# Patient Record
Sex: Female | Born: 1945
Health system: Southern US, Community
[De-identification: ages and names within clinical notes are randomized; demographics above are authoritative.]

## PROBLEM LIST (undated history)

## (undated) DIAGNOSIS — Z974 Presence of external hearing-aid: Secondary | ICD-10-CM

## (undated) DIAGNOSIS — R51 Headache: Secondary | ICD-10-CM

## (undated) DIAGNOSIS — F329 Major depressive disorder, single episode, unspecified: Secondary | ICD-10-CM

## (undated) DIAGNOSIS — G243 Spasmodic torticollis: Secondary | ICD-10-CM

## (undated) DIAGNOSIS — K219 Gastro-esophageal reflux disease without esophagitis: Secondary | ICD-10-CM

## (undated) DIAGNOSIS — M1990 Unspecified osteoarthritis, unspecified site: Secondary | ICD-10-CM

## (undated) DIAGNOSIS — K5900 Constipation, unspecified: Secondary | ICD-10-CM

## (undated) DIAGNOSIS — Z8489 Family history of other specified conditions: Secondary | ICD-10-CM

## (undated) DIAGNOSIS — J189 Pneumonia, unspecified organism: Secondary | ICD-10-CM

## (undated) DIAGNOSIS — I10 Essential (primary) hypertension: Secondary | ICD-10-CM

## (undated) DIAGNOSIS — F32A Depression, unspecified: Secondary | ICD-10-CM

## (undated) DIAGNOSIS — R0602 Shortness of breath: Secondary | ICD-10-CM

## (undated) HISTORY — PX: CERVICAL FUSION: SHX112

## (undated) HISTORY — PX: ABDOMINAL HYSTERECTOMY: SHX81

---

## 2014-06-13 ENCOUNTER — Other Ambulatory Visit: Payer: Self-pay | Admitting: Otolaryngology

## 2014-06-13 DIAGNOSIS — K148 Other diseases of tongue: Secondary | ICD-10-CM

## 2014-06-13 DIAGNOSIS — R220 Localized swelling, mass and lump, head: Principal | ICD-10-CM

## 2014-06-16 ENCOUNTER — Ambulatory Visit
Admission: RE | Admit: 2014-06-16 | Discharge: 2014-06-16 | Disposition: A | Discharge status: Home / Self Care | Payer: Medicare Other | Source: Ambulatory Visit | Attending: Otolaryngology | Admitting: Otolaryngology

## 2014-06-16 DIAGNOSIS — K148 Other diseases of tongue: Secondary | ICD-10-CM

## 2014-06-16 DIAGNOSIS — R220 Localized swelling, mass and lump, head: Principal | ICD-10-CM

## 2014-06-16 MED ORDER — IOHEXOL 300 MG/ML  SOLN
75.0000 mL | Freq: Once | INTRAMUSCULAR | Status: AC | PRN
  Administered 2014-06-16: 75 mL via INTRAVENOUS

## 2014-06-26 ENCOUNTER — Encounter (HOSPITAL_COMMUNITY): Payer: Self-pay | Admitting: Pharmacy Technician

## 2014-06-26 ENCOUNTER — Other Ambulatory Visit: Payer: Self-pay | Admitting: Otolaryngology

## 2014-06-27 ENCOUNTER — Encounter (HOSPITAL_COMMUNITY)
Admission: RE | Admit: 2014-06-27 | Discharge: 2014-06-27 | Disposition: A | Discharge status: Home / Self Care | Payer: Medicare Other | Source: Ambulatory Visit | Attending: Anesthesiology | Admitting: Anesthesiology

## 2014-06-27 ENCOUNTER — Encounter (HOSPITAL_COMMUNITY): Payer: Self-pay

## 2014-06-27 ENCOUNTER — Encounter (HOSPITAL_COMMUNITY)
Admission: RE | Admit: 2014-06-27 | Discharge: 2014-06-27 | Disposition: A | Discharge status: Home / Self Care | Payer: Medicare Other | Source: Ambulatory Visit | Attending: Otolaryngology | Admitting: Otolaryngology

## 2014-06-27 ENCOUNTER — Encounter (HOSPITAL_COMMUNITY): Payer: Self-pay | Admitting: *Deleted

## 2014-06-27 DIAGNOSIS — R220 Localized swelling, mass and lump, head: Secondary | ICD-10-CM | POA: Diagnosis present

## 2014-06-27 DIAGNOSIS — K5900 Constipation, unspecified: Secondary | ICD-10-CM | POA: Diagnosis not present

## 2014-06-27 DIAGNOSIS — Z885 Allergy status to narcotic agent status: Secondary | ICD-10-CM | POA: Diagnosis not present

## 2014-06-27 DIAGNOSIS — I10 Essential (primary) hypertension: Secondary | ICD-10-CM | POA: Diagnosis not present

## 2014-06-27 DIAGNOSIS — F3289 Other specified depressive episodes: Secondary | ICD-10-CM | POA: Diagnosis not present

## 2014-06-27 DIAGNOSIS — K219 Gastro-esophageal reflux disease without esophagitis: Secondary | ICD-10-CM | POA: Diagnosis not present

## 2014-06-27 DIAGNOSIS — Z882 Allergy status to sulfonamides status: Secondary | ICD-10-CM | POA: Diagnosis not present

## 2014-06-27 DIAGNOSIS — R221 Localized swelling, mass and lump, neck: Secondary | ICD-10-CM | POA: Diagnosis present

## 2014-06-27 DIAGNOSIS — Z87891 Personal history of nicotine dependence: Secondary | ICD-10-CM | POA: Diagnosis not present

## 2014-06-27 DIAGNOSIS — F329 Major depressive disorder, single episode, unspecified: Secondary | ICD-10-CM | POA: Diagnosis not present

## 2014-06-27 DIAGNOSIS — K140 Glossitis: Secondary | ICD-10-CM | POA: Diagnosis not present

## 2014-06-27 DIAGNOSIS — Z79899 Other long term (current) drug therapy: Secondary | ICD-10-CM | POA: Diagnosis not present

## 2014-06-27 HISTORY — DX: Spasmodic torticollis: G24.3

## 2014-06-27 LAB — CBC
HCT: 40.9 % (ref 36.0–46.0)
HEMOGLOBIN: 13.9 g/dL (ref 12.0–15.0)
MCH: 30.4 pg (ref 26.0–34.0)
MCHC: 34 g/dL (ref 30.0–36.0)
MCV: 89.5 fL (ref 78.0–100.0)
Platelets: 138 10*3/uL — ABNORMAL LOW (ref 150–400)
RBC: 4.57 MIL/uL (ref 3.87–5.11)
RDW: 12.9 % (ref 11.5–15.5)
WBC: 6.3 10*3/uL (ref 4.0–10.5)

## 2014-06-27 LAB — BASIC METABOLIC PANEL
ANION GAP: 11 (ref 5–15)
BUN: 11 mg/dL (ref 6–23)
CHLORIDE: 103 meq/L (ref 96–112)
CO2: 29 mEq/L (ref 19–32)
Calcium: 9.8 mg/dL (ref 8.4–10.5)
Creatinine, Ser: 0.65 mg/dL (ref 0.50–1.10)
GFR calc non Af Amer: >90 mL/min (ref >90)
Glucose, Bld: 92 mg/dL (ref 70–99)
POTASSIUM: 4.2 meq/L (ref 3.7–5.3)
SODIUM: 143 meq/L (ref 137–147)

## 2014-06-27 NOTE — Pre-Procedure Instructions (Addendum)
Tillie RungBarbara Harriet Huckins  06/27/2014   Your procedure is scheduled on:  Wednesday, September 30.  [facility]  9:00 AM.  Call this number if you have problems the morning of surgery: [phone]   Remember:   Do not eat food or drink liquids after midnight.   Take these medicines the morning of surgery with A SIP OF WATER: felodipine (PLENDIL), clonazePAM (KLONOPIN), meclizine (ANTIVERT), omeprazole (PRILOSEC), sertraline (ZOLOFT), baclofen (LIORESAL).                Stop taking all vitamins, Herbal medications and NSAIDS (meloxicam (MOBIC).       Do not wear jewelry, make-up or nail polish.  Do not wear lotions, powders, or perfumes.   Do not shave 48 hours prior to surgery.   Do not bring valuables to the hospital.             [facility] is not responsible  for any belongings or valuables.               Contacts, dentures or bridgework may not be worn into surgery.  Leave suitcase in the car. After surgery it may be brought to your room.  For patients admitted to the hospital, discharge time is determined by your treatment team.               Patients discharged the day of surgery will not be allowed to drive home.  Name and phone number of your driver: -   Special Instructions: Review  [place] - Preparing For Surgery.   Please read over the following fact sheets that you were given: Pain Booklet, Coughing and Deep Breathing and Surgical Site Infection Prevention

## 2014-06-27 NOTE — Progress Notes (Signed)
06/27/14 1508  OBSTRUCTIVE SLEEP APNEA  Have you ever been diagnosed with sleep apnea through a sleep study? No  Do you snore loudly (loud enough to be heard through closed doors)?  0  Do you often feel tired, fatigued, or sleepy during the daytime? 1  Has anyone observed you stop breathing during your sleep? 0  Do you have, or are you being treated for high blood pressure? 1  BMI more than 35 kg/m2? 1  Age over 66 years old? 1  Neck circumference greater than 40 cm/16 inches? 1 (16.5)  Gender: 0  Obstructive Sleep Apnea Score 5  Score 4 or greater  Results sent to PCP

## 2014-06-28 ENCOUNTER — Encounter (HOSPITAL_COMMUNITY): Payer: Self-pay | Admitting: Otolaryngology

## 2014-06-28 ENCOUNTER — Ambulatory Visit (HOSPITAL_COMMUNITY): Payer: Medicare Other | Admitting: Certified Registered Nurse Anesthetist

## 2014-06-28 ENCOUNTER — Encounter (HOSPITAL_COMMUNITY)
Admission: RE | Disposition: A | Discharge status: Home / Self Care | Payer: Self-pay | Source: Ambulatory Visit | Attending: Otolaryngology

## 2014-06-28 ENCOUNTER — Ambulatory Visit (HOSPITAL_COMMUNITY)
Admission: RE | Admit: 2014-06-28 | Discharge: 2014-06-28 | Disposition: A | Discharge status: Home / Self Care | Payer: Medicare Other | Source: Ambulatory Visit | Attending: Otolaryngology | Admitting: Otolaryngology

## 2014-06-28 ENCOUNTER — Encounter (HOSPITAL_COMMUNITY): Payer: Medicare Other | Admitting: Certified Registered Nurse Anesthetist

## 2014-06-28 DIAGNOSIS — Z882 Allergy status to sulfonamides status: Secondary | ICD-10-CM | POA: Insufficient documentation

## 2014-06-28 DIAGNOSIS — I10 Essential (primary) hypertension: Secondary | ICD-10-CM | POA: Diagnosis not present

## 2014-06-28 DIAGNOSIS — F329 Major depressive disorder, single episode, unspecified: Secondary | ICD-10-CM | POA: Insufficient documentation

## 2014-06-28 DIAGNOSIS — Z79899 Other long term (current) drug therapy: Secondary | ICD-10-CM | POA: Insufficient documentation

## 2014-06-28 DIAGNOSIS — K140 Glossitis: Secondary | ICD-10-CM | POA: Insufficient documentation

## 2014-06-28 DIAGNOSIS — K5900 Constipation, unspecified: Secondary | ICD-10-CM | POA: Insufficient documentation

## 2014-06-28 DIAGNOSIS — F3289 Other specified depressive episodes: Secondary | ICD-10-CM | POA: Insufficient documentation

## 2014-06-28 DIAGNOSIS — K219 Gastro-esophageal reflux disease without esophagitis: Secondary | ICD-10-CM | POA: Insufficient documentation

## 2014-06-28 DIAGNOSIS — R220 Localized swelling, mass and lump, head: Secondary | ICD-10-CM

## 2014-06-28 DIAGNOSIS — Z87891 Personal history of nicotine dependence: Secondary | ICD-10-CM | POA: Insufficient documentation

## 2014-06-28 DIAGNOSIS — K148 Other diseases of tongue: Secondary | ICD-10-CM | POA: Diagnosis present

## 2014-06-28 DIAGNOSIS — Z885 Allergy status to narcotic agent status: Secondary | ICD-10-CM | POA: Insufficient documentation

## 2014-06-28 HISTORY — DX: Gastro-esophageal reflux disease without esophagitis: K21.9

## 2014-06-28 HISTORY — DX: Presence of external hearing-aid: Z97.4

## 2014-06-28 HISTORY — DX: Family history of other specified conditions: Z84.89

## 2014-06-28 HISTORY — DX: Constipation, unspecified: K59.00

## 2014-06-28 HISTORY — DX: Unspecified osteoarthritis, unspecified site: M19.90

## 2014-06-28 HISTORY — DX: Major depressive disorder, single episode, unspecified: F32.9

## 2014-06-28 HISTORY — DX: Pneumonia, unspecified organism: J18.9

## 2014-06-28 HISTORY — DX: Essential (primary) hypertension: I10

## 2014-06-28 HISTORY — DX: Shortness of breath: R06.02

## 2014-06-28 HISTORY — PX: EXCISION OF TONGUE LESION: SHX6434

## 2014-06-28 HISTORY — DX: Headache: R51

## 2014-06-28 HISTORY — DX: Depression, unspecified: F32.A

## 2014-06-28 SURGERY — EXCISION, LESION, TONGUE
Anesthesia: General | Laterality: Right

## 2014-06-28 MED ORDER — AMOXICILLIN-POT CLAVULANATE 250-62.5 MG/5ML PO SUSR: 10.0000 mL | Freq: Two times a day (BID) | ORAL | Status: DC

## 2014-06-28 MED ORDER — SCOPOLAMINE 1 MG/3DAYS TD PT72
MEDICATED_PATCH | TRANSDERMAL | Status: AC
  Filled 2014-06-28: qty 1

## 2014-06-28 MED ORDER — PROPOFOL 10 MG/ML IV BOLUS
INTRAVENOUS | Status: AC
  Filled 2014-06-28: qty 20

## 2014-06-28 MED ORDER — LIDOCAINE-EPINEPHRINE 1 %-1:100000 IJ SOLN
INTRAMUSCULAR | Status: DC | PRN
  Administered 2014-06-28: 20 mL

## 2014-06-28 MED ORDER — 0.9 % SODIUM CHLORIDE (POUR BTL) OPTIME
TOPICAL | Status: DC | PRN
  Administered 2014-06-28: 1000 mL

## 2014-06-28 MED ORDER — FENTANYL CITRATE 0.05 MG/ML IJ SOLN
INTRAMUSCULAR | Status: DC | PRN
  Administered 2014-06-28: 50 ug via INTRAVENOUS
  Administered 2014-06-28: 100 ug via INTRAVENOUS

## 2014-06-28 MED ORDER — CEFAZOLIN SODIUM-DEXTROSE 2-3 GM-% IV SOLR
INTRAVENOUS | Status: AC
  Administered 2014-06-28: 2 g via INTRAVENOUS
  Filled 2014-06-28: qty 50

## 2014-06-28 MED ORDER — LIDOCAINE HCL (CARDIAC) 20 MG/ML IV SOLN
INTRAVENOUS | Status: AC
  Filled 2014-06-28: qty 5

## 2014-06-28 MED ORDER — HYDROCODONE-ACETAMINOPHEN 7.5-325 MG/15ML PO SOLN: 10.0000 mL | ORAL | Status: DC | PRN

## 2014-06-28 MED ORDER — FENTANYL CITRATE 0.05 MG/ML IJ SOLN
INTRAMUSCULAR | Status: AC
  Filled 2014-06-28: qty 5

## 2014-06-28 MED ORDER — SCOPOLAMINE 1 MG/3DAYS TD PT72
1.0000 | MEDICATED_PATCH | TRANSDERMAL | Status: DC
  Administered 2014-06-28: 1.5 mg via TRANSDERMAL

## 2014-06-28 MED ORDER — PROPOFOL 10 MG/ML IV BOLUS
INTRAVENOUS | Status: DC | PRN
  Administered 2014-06-28: 150 mg via INTRAVENOUS

## 2014-06-28 MED ORDER — ONDANSETRON HCL 4 MG/2ML IJ SOLN
INTRAMUSCULAR | Status: DC | PRN
  Administered 2014-06-28: 4 mg via INTRAVENOUS

## 2014-06-28 MED ORDER — HYDROMORPHONE HCL 1 MG/ML IJ SOLN
0.2500 mg | INTRAMUSCULAR | Status: DC | PRN
  Administered 2014-06-28: 0.5 mg via INTRAVENOUS

## 2014-06-28 MED ORDER — MIDAZOLAM HCL 5 MG/5ML IJ SOLN
INTRAMUSCULAR | Status: DC | PRN
  Administered 2014-06-28: 2 mg via INTRAVENOUS

## 2014-06-28 MED ORDER — DEXAMETHASONE SODIUM PHOSPHATE 4 MG/ML IJ SOLN
INTRAMUSCULAR | Status: DC | PRN
  Administered 2014-06-28: 10 mg via INTRAVENOUS

## 2014-06-28 MED ORDER — LACTATED RINGERS IV SOLN
INTRAVENOUS | Status: DC | PRN
  Administered 2014-06-28: 11:00:00 via INTRAVENOUS

## 2014-06-28 MED ORDER — DEXAMETHASONE SODIUM PHOSPHATE 4 MG/ML IJ SOLN
INTRAMUSCULAR | Status: AC
  Filled 2014-06-28: qty 3

## 2014-06-28 MED ORDER — SUCCINYLCHOLINE CHLORIDE 20 MG/ML IJ SOLN
INTRAMUSCULAR | Status: DC | PRN
  Administered 2014-06-28: 100 mg via INTRAVENOUS

## 2014-06-28 MED ORDER — LIDOCAINE HCL (CARDIAC) 20 MG/ML IV SOLN
INTRAVENOUS | Status: DC | PRN
  Administered 2014-06-28: 80 mg via INTRAVENOUS

## 2014-06-28 MED ORDER — HYDROMORPHONE HCL 1 MG/ML IJ SOLN
INTRAMUSCULAR | Status: AC
  Filled 2014-06-28: qty 1

## 2014-06-28 MED ORDER — MIDAZOLAM HCL 2 MG/2ML IJ SOLN
INTRAMUSCULAR | Status: AC
  Filled 2014-06-28: qty 2

## 2014-06-28 MED ORDER — PROMETHAZINE HCL 25 MG/ML IJ SOLN: 6.2500 mg | INTRAMUSCULAR | Status: DC | PRN

## 2014-06-28 MED ORDER — LACTATED RINGERS IV SOLN
INTRAVENOUS | Status: DC
  Administered 2014-06-28: 10:00:00 via INTRAVENOUS

## 2014-06-28 MED ORDER — BACITRACIN ZINC 500 UNIT/GM EX OINT
TOPICAL_OINTMENT | CUTANEOUS | Status: AC
Start: 2014-06-28 — End: 2014-06-28
  Filled 2014-06-28: qty 15

## 2014-06-28 MED ORDER — LIDOCAINE-EPINEPHRINE 1 %-1:100000 IJ SOLN
INTRAMUSCULAR | Status: AC
  Filled 2014-06-28: qty 1

## 2014-06-28 SURGICAL SUPPLY — 32 items
CANISTER SUCTION 2500CC (MISCELLANEOUS) ×2 IMPLANT
CLEANER TIP ELECTROSURG 2X2 (MISCELLANEOUS) ×2 IMPLANT
COVER SURGICAL LIGHT HANDLE (MISCELLANEOUS) ×2 IMPLANT
ELECT COATED BLADE 2.86 ST (ELECTRODE) ×2 IMPLANT
ELECT REM PT RETURN 9FT ADLT (ELECTROSURGICAL) ×2
ELECTRODE REM PT RTRN 9FT ADLT (ELECTROSURGICAL) ×1 IMPLANT
GAUZE SPONGE 4X4 16PLY XRAY LF (GAUZE/BANDAGES/DRESSINGS) ×2 IMPLANT
GLOVE BIOGEL M 7.0 STRL (GLOVE) ×2 IMPLANT
GLOVE BIOGEL PI IND STRL 7.0 (GLOVE) ×2 IMPLANT
GLOVE BIOGEL PI INDICATOR 7.0 (GLOVE) ×2
GLOVE SS N UNI LF 7.0 STRL (GLOVE) ×2 IMPLANT
GLOVE SURG SS PI 7.0 STRL IVOR (GLOVE) ×2 IMPLANT
GOWN STRL REUS W/ TWL LRG LVL3 (GOWN DISPOSABLE) ×2 IMPLANT
GOWN STRL REUS W/TWL LRG LVL3 (GOWN DISPOSABLE) ×2
KIT BASIN OR (CUSTOM PROCEDURE TRAY) ×2 IMPLANT
KIT ROOM TURNOVER OR (KITS) ×2 IMPLANT
NS IRRIG 1000ML POUR BTL (IV SOLUTION) ×2 IMPLANT
PAD ARMBOARD 7.5X6 YLW CONV (MISCELLANEOUS) ×4 IMPLANT
PENCIL BUTTON HOLSTER BLD 10FT (ELECTRODE) ×2 IMPLANT
SPONGE INTESTINAL PEANUT (DISPOSABLE) ×2 IMPLANT
SPONGE LAP 18X18 X RAY DECT (DISPOSABLE) ×2 IMPLANT
STAPLER VISISTAT 35W (STAPLE) ×2 IMPLANT
SUT ETHILON 2 0 FS 18 (SUTURE) ×2 IMPLANT
SUT SILK 2 0 FS (SUTURE) ×2 IMPLANT
SUT SILK 3 0 REEL (SUTURE) ×2 IMPLANT
SUT VIC AB 3-0 SH 27 (SUTURE) ×1
SUT VIC AB 3-0 SH 27XBRD (SUTURE) ×1 IMPLANT
SYR BULB 3OZ (MISCELLANEOUS) ×2 IMPLANT
TOWEL OR 17X24 6PK STRL BLUE (TOWEL DISPOSABLE) ×2 IMPLANT
TOWEL OR 17X26 10 PK STRL BLUE (TOWEL DISPOSABLE) ×2 IMPLANT
TRAY ENT MC OR (CUSTOM PROCEDURE TRAY) ×2 IMPLANT
TUBE CONNECTING 12X1/4 (SUCTIONS) ×2 IMPLANT

## 2014-06-28 NOTE — H&P (Signed)
Bethany Fuller is an 68 y.o. female.   Chief Complaint: Right tongue mass HPI: Rt lateral tongue mass, prev bx benign. Failure to resolve with med tx.  Past Medical History  Diagnosis Date  . Family history of anesthesia complication     Mother- nausea  . Hypertension   . Headache(784.0)     Miagrains- now only once a year. Has light migranies from Dystonia  . Shortness of breath     with extertion   . Pneumonia     2011  . Depression   . GERD (gastroesophageal reflux disease)   . Constipation   . Arthritis   . Wears hearing aid     right  . Cervical dystonia     Past Surgical History  Procedure Laterality Date  . Abdominal hysterectomy    . Cervical fusion      4, 5, 6    History reviewed. No pertinent family history. Social History:  reports that she has quit smoking. She does not have any smokeless tobacco history on file. She reports that she does not drink alcohol or use illicit drugs.  Allergies:  Allergies  Allergen Reactions  . Morphine And Related Itching and Rash  . Sulfa Antibiotics Itching and Rash    Medications Prior to Admission  Medication Sig Dispense Refill  . baclofen (LIORESAL) 10 MG tablet Take 20 mg by mouth 3 (three) times daily.      . calcium carbonate (OS-CAL) 600 MG TABS tablet Take 1,200 mg by mouth daily.      . Cholecalciferol (VITAMIN D-3) 5000 UNITS TABS Take 5,000 Units by mouth daily.      . clonazePAM (KLONOPIN) 0.5 MG tablet Take 1.5 mg by mouth 3 (three) times daily.      . felodipine (PLENDIL) 2.5 MG 24 hr tablet Take 2.5 mg by mouth daily.      Marland Kitchen gabapentin (NEURONTIN) 300 MG capsule Take 300-900 mg by mouth 3 (three) times daily. 300 mg every morning and at lunch, then take 900 mg at bedtime      . meclizine (ANTIVERT) 25 MG tablet Take 25 mg by mouth daily. Scheduled 1 time a day.      . meclizine (ANTIVERT) 25 MG tablet Take 25 mg by mouth 3 (three) times daily as needed for dizziness.      . meloxicam (MOBIC) 15 MG  tablet Take 15 mg by mouth daily.      . Multiple Vitamin (MULTIVITAMIN WITH MINERALS) TABS tablet Take 1 tablet by mouth daily.      . Multiple Vitamins-Minerals (HAIR/SKIN/NAILS PO) Take 3 tablets by mouth daily.      . Omega-3 Fatty Acids (FISH OIL) 1200 MG CAPS Take 1,200 mg by mouth daily.      Marland Kitchen omeprazole (PRILOSEC) 20 MG capsule Take 20 mg by mouth daily.      . OnabotulinumtoxinA (BOTOX IJ) Inject as directed every 3 (three) months.      Marland Kitchen oxybutynin (DITROPAN-XL) 10 MG 24 hr tablet Take 10 mg by mouth at bedtime.      . polyethylene glycol (MIRALAX / GLYCOLAX) packet Take 17 g by mouth as needed.      . Pyridoxine HCl (VITAMIN B-6 PO) Take 1 tablet by mouth daily.      Marland Kitchen rOPINIRole (REQUIP) 0.25 MG tablet Take 1 mg by mouth at bedtime.      . sertraline (ZOLOFT) 100 MG tablet Take 150 mg by mouth daily.      Marland Kitchen  simvastatin (ZOCOR) 80 MG tablet Take 80 mg by mouth at bedtime.      . vitamin E 1000 UNIT capsule Take 1,000 Units by mouth daily.      . Calcium-Magnesium-Vitamin D (CALCIUM 1200+D3 PO) Take 1 tablet by mouth daily.        Results for orders placed during the hospital encounter of 06/28/14 (from the past 48 hour(s))  BASIC METABOLIC PANEL     Status: None   Collection Time    06/27/14  3:38 PM      Result Value Ref Range   Sodium 143  137 - 147 mEq/L   Potassium 4.2  3.7 - 5.3 mEq/L   Chloride 103  96 - 112 mEq/L   CO2 29  19 - 32 mEq/L   Glucose, Bld 92  70 - 99 mg/dL   BUN 11  6 - 23 mg/dL   Creatinine, Ser 0.65  0.50 - 1.10 mg/dL   Calcium 9.8  8.4 - 10.5 mg/dL   GFR calc non Af Amer >90  >90 mL/min   GFR calc Af Amer >90  >90 mL/min   Comment: (NOTE)     The eGFR has been calculated using the CKD EPI equation.     This calculation has not been validated in all clinical situations.     eGFR's persistently <90 mL/min signify possible Chronic Kidney     Disease.   Anion gap 11  5 - 15  CBC     Status: Abnormal   Collection Time    06/27/14  3:38 PM       Result Value Ref Range   WBC 6.3  4.0 - 10.5 K/uL   RBC 4.57  3.87 - 5.11 MIL/uL   Hemoglobin 13.9  12.0 - 15.0 g/dL   HCT 40.9  36.0 - 46.0 %   MCV 89.5  78.0 - 100.0 fL   MCH 30.4  26.0 - 34.0 pg   MCHC 34.0  30.0 - 36.0 g/dL   RDW 12.9  11.5 - 15.5 %   Platelets 138 (*) 150 - 400 K/uL   Dg Chest 2 View  06/27/2014   CLINICAL DATA:  Preop tongue surgery.  EXAM: CHEST  2 VIEW  COMPARISON:  None.  FINDINGS: The heart size and mediastinal contours are within normal limits. Both lungs are clear. The visualized skeletal structures are unremarkable.  IMPRESSION: No active cardiopulmonary disease.   Electronically Signed   By: Rolm Baptise M.D.   On: 06/27/2014 16:18    Review of Systems  Constitutional: Negative.   HENT: Negative.   Respiratory: Negative.   Cardiovascular: Negative.   Gastrointestinal: Negative.     There were no vitals taken for this visit. Physical Exam  Constitutional: She is oriented to person, place, and time. She appears well-developed and well-nourished.  HENT:  Rt lateral tongue mass  Neck: Normal range of motion. Neck supple.  Cardiovascular: Normal rate.   Respiratory: Effort normal.  GI: Soft.  Musculoskeletal: Normal range of motion.  Neurological: She is alert and oriented to person, place, and time.     Assessment/Plan Adm for OP excision rt tongue mass  Promiss Labarbera 06/28/2014, 8:45 AM

## 2014-06-28 NOTE — Transfer of Care (Signed)
Immediate Anesthesia Transfer of Care Note  Patient: Bethany Fuller  Procedure(s) Performed: Procedure(s): WIDE LOCAL EXCISION OF THE RIGHT LATERAL TONGUE MASS (Right)  Patient Location: PACU  Anesthesia Type:General  Level of Consciousness: awake, alert  and oriented  Airway & Oxygen Therapy: Patient Spontanous Breathing and Patient connected to nasal cannula oxygen  Post-op Assessment: Report given to PACU RN, Post -op Vital signs reviewed and stable and Patient moving all extremities  Post vital signs: Reviewed and stable  Complications: No apparent anesthesia complications

## 2014-06-28 NOTE — Brief Op Note (Signed)
06/28/2014  12:09 PM  PATIENT:  Bethany RungBarbara Harriet Fuller  68 y.o. female  PRE-OPERATIVE DIAGNOSIS:  TONGUE MASS  POST-OPERATIVE DIAGNOSIS:  TONGUE MASS  PROCEDURE:  Procedure(s): WIDE LOCAL EXCISION OF THE RIGHT LATERAL TONGUE MASS (Right)  SURGEON:  Surgeon(s) and Role:    * Osborn Cohoavid Shirlie Enck, MD - Primary  PHYSICIAN ASSISTANT:   ASSISTANTS: none   ANESTHESIA:   general  EBL:  Total I/O In: 600 [I.V.:600] Out: - <50 cc  BLOOD ADMINISTERED:none  DRAINS: none and Gastrostomy Tube   LOCAL MEDICATIONS USED:  NONE  SPECIMEN:  Source of Specimen:  Rt tongue lesion  DISPOSITION OF SPECIMEN:  PATHOLOGY  COUNTS:  YES  TOURNIQUET:  * No tourniquets in log *  DICTATION: .Other Dictation: Dictation Number [id number]  PLAN OF CARE: Discharge to home after PACU  PATIENT DISPOSITION:  PACU - hemodynamically stable.   Delay start of Pharmacological VTE agent (>24hrs) due to surgical blood loss or risk of bleeding: not applicable

## 2014-06-28 NOTE — Anesthesia Preprocedure Evaluation (Signed)
Anesthesia Evaluation  Patient identified by MRN, date of birth, ID band Patient awake    Reviewed: Allergy & Precautions, H&P , NPO status , Patient's Chart, lab work & pertinent test results  History of Anesthesia Complications Negative for: history of anesthetic complications  Airway Mallampati: II TM Distance: >3 FB Neck ROM: Full    Dental  (+) Dental Advisory Given, Poor Dentition   Pulmonary former smoker,    Pulmonary exam normal       Cardiovascular hypertension, Pt. on medications     Neuro/Psych  Headaches, PSYCHIATRIC DISORDERS Depression    GI/Hepatic GERD-  Medicated,  Endo/Other  Morbid obesity  Renal/GU      Musculoskeletal  (+) Arthritis -,   Abdominal   Peds  Hematology   Anesthesia Other Findings   Reproductive/Obstetrics                           Anesthesia Physical Anesthesia Plan  ASA: III  Anesthesia Plan: General   Post-op Pain Management:    Induction: Intravenous  Airway Management Planned: Oral ETT  Additional Equipment:   Intra-op Plan:   Post-operative Plan: Extubation in OR  Informed Consent: I have reviewed the patients History and Physical, chart, labs and discussed the procedure including the risks, benefits and alternatives for the proposed anesthesia with the patient or authorized representative who has indicated his/her understanding and acceptance.   Dental advisory given  Plan Discussed with: CRNA, Anesthesiologist and Surgeon  Anesthesia Plan Comments:         Anesthesia Quick Evaluation

## 2014-06-28 NOTE — Progress Notes (Signed)
Pt sitting up in recliner. Minimal pain. VSS.  Maintaining sats 94% on room air. Pt and her mother feel comfortable going home.

## 2014-06-28 NOTE — Progress Notes (Signed)
Pt is very sleepy & has requested to stay overnight. Unable to wean off O2 at this time. Sats are 85% on room air. Dr. Annalee GentaShoemaker has been notified, and says pt should be d/c'd to home as originally planned. Will continue to monitor pt in PACU, until she is appropriate for d/c.

## 2014-06-28 NOTE — Anesthesia Postprocedure Evaluation (Signed)
Anesthesia Post Note  Patient: Bethany Fuller  Procedure(s) Performed: Procedure(s) (LRB): WIDE LOCAL EXCISION OF THE RIGHT LATERAL TONGUE MASS (Right)  Anesthesia type: general  Patient location: PACU  Post pain: Pain level controlled  Post assessment: Patient's Cardiovascular Status Stable  Last Vitals:  Filed Vitals:   06/28/14 1218  BP: 181/91  Pulse: 110  Temp: 36.9 C  Resp: 20    Post vital signs: Reviewed and stable  Level of consciousness: sedated  Complications: No apparent anesthesia complications

## 2014-06-28 NOTE — Anesthesia Procedure Notes (Signed)
Procedure Name: Intubation Date/Time: 06/28/2014 11:25 AM Performed by: Orvilla FusATO, Lilli Dewald A Pre-anesthesia Checklist: Patient identified, Emergency Drugs available, Suction available, Patient being monitored and Timeout performed Patient Re-evaluated:Patient Re-evaluated prior to inductionOxygen Delivery Method: Circle system utilized Preoxygenation: Pre-oxygenation with 100% oxygen Intubation Type: IV induction Ventilation: Mask ventilation without difficulty Grade View: Grade II Tube type: Oral Tube size: 6.5 mm Number of attempts: 2 (DL grade II MAC3- tube dislodged when moved to L side of mouth. DL x1 with glidescope grade I view.  ) Airway Equipment and Method: Video-laryngoscopy and Stylet Placement Confirmation: ETT inserted through vocal cords under direct vision,  positive ETCO2 and breath sounds checked- equal and bilateral Secured at: 21 cm Tube secured with: Tape Dental Injury: Teeth and Oropharynx as per pre-operative assessment

## 2014-06-29 ENCOUNTER — Encounter (HOSPITAL_COMMUNITY): Payer: Self-pay | Admitting: Otolaryngology

## 2014-06-29 NOTE — Op Note (Signed)
Bethany Fuller, Bethany Fuller             ACCOUNT NO.:  [id number]  MEDICAL RECORD NO.:  [id number]  LOCATION:  [facility]                         FACILITY:  [facility]  PHYSICIAN:  Kinnie Scales. Annalee Genta, M.D.DATE OF BIRTH:  November 20, 1945  DATE OF PROCEDURE:  06/28/2014 DATE OF DISCHARGE:  06/28/2014                              OPERATIVE REPORT   LOCATION:  [facility] Main OR.  PREOPERATIVE DIAGNOSIS:  Right lateral tongue lesion.  POSTOPERATIVE DIAGNOSIS:  Right lateral tongue lesion.  INDICATION FOR SURGERY:  Right lateral tongue lesion.  SURGICAL PROCEDURE:  Wide local excision of lateral tongue lesion with closure.  ANESTHESIA:  General endotracheal.  COMPLICATIONS:  None.  ESTIMATED BLOOD LOSS:  Less than 50 mL.  The patient was transferred from the operating room to the recovery room in stable condition.  BRIEF HISTORY:  The patient is a 68 year old white female who was referred to our office for evaluation of a right lateral tongue mass and nonhealing ulcer.  Symptoms have been ongoing for approximately 4 months and the patient had been seen and biopsied by an oral surgeon with benign results.  The area was nonhealing and despite antibiotics, topical rinse and conservative medical therapy, the patient continued to have a pedunculated soft tissue mass with inflammation and some intermittent bleeding.  Given the patient's history, examination, and physical findings, I recommended wide local excision of the mass under general anesthesia and closure to allow for adequate healing and pathological diagnosis.  The risks and benefits of the procedure were discussed in detail with the patient and her mother, and they understood and concurred with our plan, which was scheduled as an outpatient under general anesthesia at [facility] OR.  DESCRIPTION OF PROCEDURE:  The patient was brought to the operating room on June 28, 2014, placed in supine position on the operating  table. General endotracheal anesthesia was established without difficulty.  The patient was adequately anesthetized.  She was positioned on the operating table, prepped and draped in a sterile fashion.  Surgical time- out was performed and the patient was prepared for surgery.  With the patient positioned prepped and draped, and prepared for surgery, her oral cavity was examined.  A mouth block was placed and the tongue was examined along the right-hand side.  There was a 1 cm exophytic, pedunculated mass along the right anterolateral aspect of the tongue.  A retraction suture was placed in the midline of the tongue consisting of 3-0 Ethilon suture and the tongue was distracted anteriorly.  Bovie electrocautery was then used to demarcate a 0.5-cm margin around the entire lesion, which was then resected using Bovie electrocautery dissecting through the mucosa, underlying muscular layer with removal of the entire area of abnormal tissue.  This was marked and sent to Pathology for gross microscopic evaluation.  The patient's oral cavity was then thoroughly irrigated with sterile saline solution and the tongue defect, which measured approximately 1 x 3 cm was closed with interrupted horizontal mattressing sutures consisting of 3-0 Vicryl suture.  The patient's oral cavity was again irrigated.  There was no bleeding or swelling.  An orogastric tube was passed.  The stomach contents were aspirated.  The patient was  then awakened from anesthetic. She was extubated and then transferred from the operating room to the recovery room in stable condition.  No complications and blood loss was less than 50 mL.          ______________________________ Kinnie Scalesavid L. Annalee GentaShoemaker, M.D.     DLS/MEDQ  D:  16/10/960409/30/2015  T:  06/29/2014  Job:  [id number]

## 2015-11-28 DIAGNOSIS — F17200 Nicotine dependence, unspecified, uncomplicated: Secondary | ICD-10-CM | POA: Diagnosis not present

## 2015-11-28 DIAGNOSIS — B372 Candidiasis of skin and nail: Secondary | ICD-10-CM | POA: Diagnosis not present

## 2015-11-28 DIAGNOSIS — Z6832 Body mass index (BMI) 32.0-32.9, adult: Secondary | ICD-10-CM | POA: Diagnosis not present

## 2015-12-10 DIAGNOSIS — G501 Atypical facial pain: Secondary | ICD-10-CM | POA: Diagnosis not present

## 2015-12-11 DIAGNOSIS — I10 Essential (primary) hypertension: Secondary | ICD-10-CM | POA: Diagnosis not present

## 2015-12-11 DIAGNOSIS — F17200 Nicotine dependence, unspecified, uncomplicated: Secondary | ICD-10-CM | POA: Diagnosis not present

## 2015-12-11 DIAGNOSIS — Z6831 Body mass index (BMI) 31.0-31.9, adult: Secondary | ICD-10-CM | POA: Diagnosis not present

## 2015-12-11 DIAGNOSIS — R079 Chest pain, unspecified: Secondary | ICD-10-CM | POA: Diagnosis not present

## 2015-12-17 ENCOUNTER — Ambulatory Visit (INDEPENDENT_AMBULATORY_CARE_PROVIDER_SITE_OTHER): Payer: Medicare Other | Admitting: Cardiovascular Disease

## 2015-12-17 VITALS — BP 128/82 | HR 81 | Ht 63.0 in | Wt 175.2 lb

## 2015-12-17 DIAGNOSIS — I10 Essential (primary) hypertension: Secondary | ICD-10-CM

## 2015-12-17 DIAGNOSIS — M47812 Spondylosis without myelopathy or radiculopathy, cervical region: Secondary | ICD-10-CM

## 2015-12-17 DIAGNOSIS — R0789 Other chest pain: Secondary | ICD-10-CM | POA: Diagnosis not present

## 2015-12-17 DIAGNOSIS — R0602 Shortness of breath: Secondary | ICD-10-CM | POA: Diagnosis not present

## 2015-12-17 DIAGNOSIS — K219 Gastro-esophageal reflux disease without esophagitis: Secondary | ICD-10-CM | POA: Diagnosis not present

## 2015-12-17 DIAGNOSIS — E785 Hyperlipidemia, unspecified: Secondary | ICD-10-CM

## 2015-12-17 DIAGNOSIS — Z716 Tobacco abuse counseling: Secondary | ICD-10-CM

## 2015-12-17 MED ORDER — ATORVASTATIN CALCIUM 40 MG PO TABS: 40.0000 mg | ORAL_TABLET | Freq: Every day | ORAL | Status: DC

## 2015-12-17 NOTE — Patient Instructions (Signed)
LABS FASTING - DO NOT EAT OR DRINK THE MORNING OF THE TEST-CBC,CMP,TSH,LIPID  Your physician has requested that you have an echocardiogram [address]. Echocardiography is a painless test that uses sound waves to create images of your heart. It provides your doctor with information about the size and shape of your heart and how well your heart's chambers and valves are working. This procedure takes approximately one hour. There are no restrictions for this procedure.  Your physician has requested that you have a lexiscan myoview at [address] . For further information please visit [email or web address]. Please follow instruction sheet, as given.  Increase metoprolol tartrate  25 mg one tablet twice a day  Your physician recommends that you schedule a follow-up appointment in:4-6 WEEKS DR  Tresa EndoKELLY.

## 2015-12-18 ENCOUNTER — Encounter: Payer: Self-pay | Admitting: Cardiovascular Disease

## 2015-12-18 DIAGNOSIS — K219 Gastro-esophageal reflux disease without esophagitis: Secondary | ICD-10-CM | POA: Insufficient documentation

## 2015-12-18 DIAGNOSIS — E785 Hyperlipidemia, unspecified: Secondary | ICD-10-CM | POA: Insufficient documentation

## 2015-12-18 DIAGNOSIS — I10 Essential (primary) hypertension: Secondary | ICD-10-CM | POA: Insufficient documentation

## 2015-12-18 DIAGNOSIS — M47812 Spondylosis without myelopathy or radiculopathy, cervical region: Secondary | ICD-10-CM | POA: Insufficient documentation

## 2015-12-18 DIAGNOSIS — Z716 Tobacco abuse counseling: Secondary | ICD-10-CM | POA: Insufficient documentation

## 2015-12-18 DIAGNOSIS — R0789 Other chest pain: Secondary | ICD-10-CM | POA: Insufficient documentation

## 2015-12-18 NOTE — Progress Notes (Signed)
Patient ID: Bethany Fuller, female   DOB: 09-23-1946, 70 y.o.   MRN: [id number]     Primary MD:  Dr. Foye Deer  PATIENT PROFILE: Bethany Fuller is a 70 y.o. female  Who is referred through the courtesy of Dr. Barney Drain for evaluation of significant recent blood pressure lability as well as chest tightness.  HPI:  Bethany Fuller  Admits to at least a 10 year history of hypertension. Remotely, she had been on felodipine for blood pressure control  And she states that she ultimately was taken off this therapy and had been off this for the past year. She recently had been evaluated and her blood pressure was 162/107 and then when she saw physician at [facility].  Her blood pressure was 152/101. She was recently evaluated I, Dr. Tomasa Blase on 12/11/2015 and felodipine was reinstituted. Patient had also complained of some mild headaches and dizziness.  He has a history of tobacco use , family history for CAD, as well as hyperlipidemia. She describes her recent chest pain as a tightness associated with shortness of breath with mild radiation to her neck and shoulders her symptoms can occur at any time and are not classically exertionally precipitated. She has had significant difficulty with her mouth and had recently seen a neurologist for facial pain.  Additional medical problems include history of iron deficiency anemia, seasonal rhinitis, degenerative disease of her cervical vertebral region, osteoarthritis, depression, restless leg syndrome, and urinary incontinence.  Past Medical History  Diagnosis Date  . Family history of anesthesia complication     Mother- nausea  . Hypertension   . Headache(784.0)     Miagrains- now only once a year. Has light migranies from Dystonia  . Shortness of breath     with extertion   . Pneumonia     2011  . Depression   . GERD (gastroesophageal reflux disease)   . Constipation   . Arthritis   . Wears hearing aid     right  . Cervical  dystonia     Past Surgical History  Procedure Laterality Date  . Abdominal hysterectomy    . Cervical fusion      4, 5, 6  . Excision of tongue lesion Right 06/28/2014    Procedure: WIDE LOCAL EXCISION OF THE RIGHT LATERAL TONGUE MASS;  Surgeon: Osborn Coho, MD;  Location: [facility] OR;  Service: ENT;  Laterality: Right;    Allergies  Allergen Reactions  . Morphine And Related Itching and Rash  . Sulfa Antibiotics Itching and Rash    Current Outpatient Prescriptions  Medication Sig Dispense Refill  . amoxicillin-clavulanate (AUGMENTIN) 250-62.5 MG/5ML suspension Take 10 mLs by mouth 2 (two) times daily. 200 mL 0  . baclofen (LIORESAL) 10 MG tablet Take 20 mg by mouth 3 (three) times daily.    . calcium carbonate (OS-CAL) 600 MG TABS tablet Take 1,200 mg by mouth daily.    . Calcium-Magnesium-Vitamin D (CALCIUM 1200+D3 PO) Take 1 tablet by mouth daily.    . Cholecalciferol (VITAMIN D-3) 5000 UNITS TABS Take 5,000 Units by mouth daily.    . clonazePAM (KLONOPIN) 0.5 MG tablet Take 1.5 mg by mouth 3 (three) times daily.    . felodipine (PLENDIL) 5 MG 24 hr tablet Take 5 mg by mouth daily.  0  . gabapentin (NEURONTIN) 300 MG capsule Take 300-900 mg by mouth 3 (three) times daily. 600 mg in the morning, 300 mg at lunch and 1200 mg at night    .  meclizine (ANTIVERT) 25 MG tablet Take 25 mg by mouth daily. Scheduled 1 time a day.    . meloxicam (MOBIC) 15 MG tablet Take 15 mg by mouth daily.    . Multiple Vitamin (MULTIVITAMIN WITH MINERALS) TABS tablet Take 1 tablet by mouth daily.    . Multiple Vitamins-Minerals (HAIR/SKIN/NAILS PO) Take 3 tablets by mouth daily.    . nicotine (NICODERM CQ - DOSED IN MG/24 HOURS) 21 mg/24hr patch APPLY ONE PATCH EVERY DAY  0  . Omega-3 Fatty Acids (FISH OIL) 1200 MG CAPS Take 1,200 mg by mouth daily.    Marland Kitchen. omeprazole (PRILOSEC) 20 MG capsule Take 20 mg by mouth daily.    . OnabotulinumtoxinA (BOTOX IJ) Inject as directed every 3 (three) months.    .  pilocarpine (SALAGEN) 5 MG tablet Take 5 mg by mouth 3 (three) times daily.  12  . polyethylene glycol (MIRALAX / GLYCOLAX) packet Take 17 g by mouth as needed.    . Pyridoxine HCl (VITAMIN B-6 PO) Take 1 tablet by mouth daily.    Marland Kitchen. rOPINIRole (REQUIP) 0.25 MG tablet Take 1 mg by mouth at bedtime.    . sertraline (ZOLOFT) 100 MG tablet Take 150 mg by mouth daily.    Marland Kitchen. tiZANidine (ZANAFLEX) 4 MG tablet Take 1 tablet by mouth at bedtime.    . vitamin E 1000 UNIT capsule Take 1,000 Units by mouth daily.    Marland Kitchen. atorvastatin (LIPITOR) 40 MG tablet Take 1 tablet (40 mg total) by mouth daily. 30 tablet 6   No current facility-administered medications for this visit.    Social History   Social History  . Marital Status: Divorced    Spouse Name: N/A  . Number of Children: N/A  . Years of Education: N/A   Occupational History  . Not on file.   Social History Main Topics  . Smoking status: Former Smoker -- 35 years  . Smokeless tobacco: Not on file  . Alcohol Use: No  . Drug Use: No  . Sexual Activity: Yes    Birth Control/ Protection: Patch     Comment: quit smoking10 /2014   Other Topics Concern  . Not on file   Social History Narrative   Additional social history is notable and that she is divorced for many years.  She has one child one grandchild. She lives with her mother and brother. She has been on disability. She has been smoking intermittently for 20-30 years.  She does not drink alcohol.  She does not routinely exercise.   Family history is notable that her mother is living at age 68 and has diabetes.  Father died at age 68 and had dementia, Parkinson's disease and COPD.  Her brother is 1763 and has a neuropathy.  Her child is [id number].  ROS General: Negative; No fevers, chills, or night sweats HEENT:  Positive for facial pain; No changes in vision or hearing, sinus congestion, difficulty swallowing Pulmonary: Negative; No cough, wheezing, shortness of breath,  hemoptysis Cardiovascular:  See HPI;  GI:  Positive for GERD GU: Negative; No dysuria, hematuria, or difficulty voiding Musculoskeletal:  Positive for cervical disc disease and arthritis. Hematologic/Oncologic: Negative; no easy bruising, bleeding Endocrine: Negative; no heat/cold intolerance; no diabetes Neuro: Negative; no changes in balance, headaches Skin: Negative; No rashes or skin lesions Psychiatric:  Positive for anxiety Sleep: Negative; No daytime sleepiness, hypersomnolence, bruxism, restless legs, hypnogagnic hallucinations Other comprehensive 14 point system review is negative   Physical Exam BP 128/82 mmHg  Pulse 81  Ht  (1.6 m)  Wt 175 lb 3.2 oz (79.47 kg)  BMI 31.04 kg/m2   Repeat blood pressure 136/84  Wt Readings from Last 3 Encounters:  12/17/15 175 lb 3.2 oz (79.47 kg)  06/28/14 209 lb 3 oz (94.887 kg)  06/27/14 209 lb 3.2 oz (94.892 kg)   General: Alert, oriented, no distress.  Skin: normal turgor, no rashes, warm and dry HEENT: Normocephalic, atraumatic. Pupils equal round and reactive to light; sclera anicteric; extraocular muscles intact; Fundi without hemorrhages or exudates Nose without nasal septal hypertrophy Mouth/Parynx benign; Mallinpatti scale 2/3 Neck: No JVD, no carotid bruits; normal carotid upstroke Lungs: clear to ausculatation and percussion; no wheezing or rales Chest wall: without tenderness to palpitation Heart: PMI not displaced, RRR, s1 s2 normal, 1/6 systolic murmur, no diastolic murmur, no rubs, gallops, thrills, or heaves Abdomen: soft, nontender; no hepatosplenomehaly, BS+; abdominal aorta nontender and not dilated by palpation. Back: no CVA tenderness Pulses 2+ Musculoskeletal: full range of motion, normal strength, no joint deformities Extremities: no clubbing cyanosis or edema, Homan's sign negative  Neurologic: grossly nonfocal; Cranial nerves grossly wnl Psychologic: Normal mood and affect   ECG (independently read  by me):  Normal sinus rhythm at 81 bpm.  Normal intervals.  No ST segment changes.  LABS:  BMP Latest Ref Rng 06/27/2014  Glucose 70 - 99 mg/dL 92  BUN 6 - 23 mg/dL 11  Creatinine 1.61 - 0.96 mg/dL 0.45  Sodium 409 - 811 mEq/L 143  Potassium 3.7 - 5.3 mEq/L 4.2  Chloride 96 - 112 mEq/L 103  CO2 19 - 32 mEq/L 29  Calcium 8.4 - 10.5 mg/dL 9.8    No flowsheet data found.  CBC Latest Ref Rng 06/27/2014  WBC 4.0 - 10.5 K/uL 6.3  Hemoglobin 12.0 - 15.0 g/dL 91.4  Hematocrit 78.2 - 46.0 % 40.9  Platelets 150 - 400 K/uL 138(L)   Lab Results  Component Value Date   MCV 89.5 06/27/2014   No results found for: TSH No results found for: HGBA1C   BNP No results found for: BNP  ProBNP No results found for: PROBNP   Lipid Panel  No results found for: CHOL, TRIG, HDL, CHOLHDL, VLDL, LDLCALC, LDLDIRECT  RADIOLOGY: No results found.   ASSESSMENT AND PLAN:  Bethany Fuller is a 70 year old female who has at least a 10 year history of hypertension and remotely had been treated with felodipine long-term.  She had been off therapy for almost a year and recently has developed recurrent blood pressure lability.  Her blood pressure today is improved with reinstitution of felodipine by her primary physician.  She has experienced episodes of chest pain which he describes as a chest tightness in the center of her chest like a weight sitting on her chest associated with shortness of breath.  Oftentimes this is non-exertionally precipitated. Her ECG is unremarkable. Presently, I am recommending a complete set of laboratory be obtained on her multiple medical regimen.  She has been on simvastatin 80 mg for hyperlipidemia and with current recommendations I am recommending she discontinue the simvastatin and change this to atorvastatin 40 mg since 80 mg of simvastatin is no longer recommended.  I am starting her on metoprolol 25 mg twice a day, which will be helpful both for blood pressure lability as  well as potential anti-ischemic benefit. I'm scheduling her for an echo Doppler study to evaluate both systolic and diastolic function and valvular architecture.  She will undergo a  Lexiscan Myoview study to evaluate myocardial perfusion.  She has GERD but this is fairly well-controlled with her current dose of Prilosec. She has been using an nicotine patch to assist with her smoking cessation.  We discussed the importance of tobacco cessation. She is on both Zoloft for anxiety and Klonopin for her cervical dystonia. I have recommended she reduce her meloxicam from 15 mg to 7.5 mg and potentially take only on an as-needed basis in light of her recent blood pressure issues. She showed me potential medication that the neurologist was considering instituting and I recommended that she not institute indomethacin. I will see her back in the office in 4-6 weeks for reevaluation.   Lennette Bihari, MD, [facility] 12/18/2015 6:34 PM

## 2016-01-02 ENCOUNTER — Telehealth (HOSPITAL_COMMUNITY): Payer: Self-pay | Admitting: *Deleted

## 2016-01-02 NOTE — Telephone Encounter (Signed)
Error

## 2016-01-02 NOTE — Telephone Encounter (Signed)
Patient given detailed instructions per Myocardial Perfusion Study Information Sheet for the test on 01/07/16. Patient notified to arrive 15 minutes early and that it is imperative to arrive on time for appointment to keep from having the test rescheduled.  If you need to cancel or reschedule your appointment, please call the office within 24 hours of your appointment. Failure to do so may result in a cancellation of your appointment, and a $50 no show fee. Patient verbalized understanding.Maurina Fawaz J Saulo Anthis, RN   

## 2016-01-07 ENCOUNTER — Ambulatory Visit (HOSPITAL_BASED_OUTPATIENT_CLINIC_OR_DEPARTMENT_OTHER): Payer: Medicare Other

## 2016-01-07 ENCOUNTER — Ambulatory Visit (HOSPITAL_COMMUNITY): Payer: Medicare Other | Attending: Cardiovascular Disease

## 2016-01-07 ENCOUNTER — Other Ambulatory Visit: Payer: Self-pay

## 2016-01-07 ENCOUNTER — Telehealth: Payer: Self-pay | Admitting: Cardiovascular Disease

## 2016-01-07 DIAGNOSIS — I358 Other nonrheumatic aortic valve disorders: Secondary | ICD-10-CM | POA: Diagnosis not present

## 2016-01-07 DIAGNOSIS — Z87891 Personal history of nicotine dependence: Secondary | ICD-10-CM | POA: Insufficient documentation

## 2016-01-07 DIAGNOSIS — R9439 Abnormal result of other cardiovascular function study: Secondary | ICD-10-CM | POA: Diagnosis not present

## 2016-01-07 DIAGNOSIS — R0789 Other chest pain: Secondary | ICD-10-CM | POA: Diagnosis not present

## 2016-01-07 DIAGNOSIS — E785 Hyperlipidemia, unspecified: Secondary | ICD-10-CM | POA: Diagnosis not present

## 2016-01-07 DIAGNOSIS — R42 Dizziness and giddiness: Secondary | ICD-10-CM | POA: Insufficient documentation

## 2016-01-07 DIAGNOSIS — I10 Essential (primary) hypertension: Secondary | ICD-10-CM | POA: Diagnosis not present

## 2016-01-07 DIAGNOSIS — R0602 Shortness of breath: Secondary | ICD-10-CM | POA: Diagnosis not present

## 2016-01-07 DIAGNOSIS — R079 Chest pain, unspecified: Secondary | ICD-10-CM | POA: Diagnosis present

## 2016-01-07 DIAGNOSIS — Z8249 Family history of ischemic heart disease and other diseases of the circulatory system: Secondary | ICD-10-CM | POA: Insufficient documentation

## 2016-01-07 LAB — MYOCARDIAL PERFUSION IMAGING
CHL CUP NUCLEAR SDS: 3
CHL CUP NUCLEAR SRS: 7
CHL CUP NUCLEAR SSS: 10
LHR: 0.15
LV sys vol: 39 mL
LVDIAVOL: 94 mL (ref 46–106)
Peak HR: 86 {beats}/min
Rest HR: 63 {beats}/min
TID: 1.11

## 2016-01-07 MED ORDER — REGADENOSON 0.4 MG/5ML IV SOLN
0.4000 mg | Freq: Once | INTRAVENOUS | Status: AC
  Administered 2016-01-07: 0.4 mg via INTRAVENOUS

## 2016-01-07 MED ORDER — TECHNETIUM TC 99M SESTAMIBI GENERIC - CARDIOLITE
32.6000 | Freq: Once | INTRAVENOUS | Status: AC | PRN
  Administered 2016-01-07: 33 via INTRAVENOUS

## 2016-01-07 MED ORDER — TECHNETIUM TC 99M SESTAMIBI GENERIC - CARDIOLITE
10.3000 | Freq: Once | INTRAVENOUS | Status: AC | PRN
  Administered 2016-01-07: 10 via INTRAVENOUS

## 2016-01-07 NOTE — Telephone Encounter (Signed)
Returned call, pt already left lab and rescheduled w Raiann for tomorrow - need was for fasting bloodwork anyway. Acknowledged correct protocol followed.

## 2016-01-07 NOTE — Telephone Encounter (Signed)
New message   [facility] lab calling for pt, to have labs done but pt ate and rn wants rn to call her to get approval

## 2016-01-08 DIAGNOSIS — R0789 Other chest pain: Secondary | ICD-10-CM | POA: Diagnosis not present

## 2016-01-08 DIAGNOSIS — R0602 Shortness of breath: Secondary | ICD-10-CM | POA: Diagnosis not present

## 2016-01-08 LAB — CBC
HCT: 43.5 % (ref 35.0–45.0)
Hemoglobin: 14.7 g/dL (ref 11.7–15.5)
MCH: 30.9 pg (ref 27.0–33.0)
MCHC: 33.8 g/dL (ref 32.0–36.0)
MCV: 91.4 fL (ref 80.0–100.0)
MPV: 11 fL (ref 7.5–12.5)
PLATELETS: 173 10*3/uL (ref 140–400)
RBC: 4.76 MIL/uL (ref 3.80–5.10)
RDW: 13.4 % (ref 11.0–15.0)
WBC: 5.6 10*3/uL (ref 3.8–10.8)

## 2016-01-09 LAB — COMPREHENSIVE METABOLIC PANEL
ALT: 29 U/L (ref 6–29)
AST: 24 U/L (ref 10–35)
Albumin: 4.5 g/dL (ref 3.6–5.1)
Alkaline Phosphatase: 63 U/L (ref 33–130)
BUN: 13 mg/dL (ref 7–25)
CHLORIDE: 99 mmol/L (ref 98–110)
CO2: 30 mmol/L (ref 20–31)
Calcium: 9.5 mg/dL (ref 8.6–10.4)
Creat: 0.64 mg/dL (ref 0.50–0.99)
GLUCOSE: 74 mg/dL (ref 65–99)
POTASSIUM: 3.9 mmol/L (ref 3.5–5.3)
Sodium: 139 mmol/L (ref 135–146)
TOTAL PROTEIN: 6.6 g/dL (ref 6.1–8.1)
Total Bilirubin: 0.7 mg/dL (ref 0.2–1.2)

## 2016-01-09 LAB — LIPID PANEL
CHOL/HDL RATIO: 3.2 ratio (ref <=5.0)
CHOLESTEROL: 147 mg/dL (ref 125–200)
HDL: 46 mg/dL (ref >=46)
LDL CALC: 64 mg/dL (ref <130)
Triglycerides: 184 mg/dL — ABNORMAL HIGH (ref <150)
VLDL: 37 mg/dL — AB (ref <30)

## 2016-01-09 LAB — TSH: TSH: 0.84 mIU/L

## 2016-01-24 ENCOUNTER — Encounter: Payer: Self-pay | Admitting: Cardiovascular Disease

## 2016-01-24 ENCOUNTER — Ambulatory Visit (INDEPENDENT_AMBULATORY_CARE_PROVIDER_SITE_OTHER): Payer: Medicare Other | Admitting: Cardiovascular Disease

## 2016-01-24 VITALS — BP 124/79 | HR 67 | Ht 63.0 in | Wt 181.0 lb

## 2016-01-24 DIAGNOSIS — R0789 Other chest pain: Secondary | ICD-10-CM

## 2016-01-24 DIAGNOSIS — I10 Essential (primary) hypertension: Secondary | ICD-10-CM

## 2016-01-24 DIAGNOSIS — E785 Hyperlipidemia, unspecified: Secondary | ICD-10-CM | POA: Diagnosis not present

## 2016-01-24 DIAGNOSIS — K219 Gastro-esophageal reflux disease without esophagitis: Secondary | ICD-10-CM

## 2016-01-24 MED ORDER — AMLODIPINE BESYLATE 5 MG PO TABS: ORAL_TABLET | ORAL | Status: DC

## 2016-01-24 NOTE — Patient Instructions (Signed)
Your physician has recommended you make the following change in your medication:   1.) start amlodipine as directed per Dr. Tresa EndoKelly.  Your physician wants you to follow-up in: 6 months or sooner if needed. You will receive a reminder letter in the mail two months in advance. If you don't receive a letter, please call our office to schedule the follow-up appointment.  If you need a refill on your cardiac medications before your next appointment, please call your pharmacy.

## 2016-01-26 ENCOUNTER — Encounter: Payer: Self-pay | Admitting: Cardiovascular Disease

## 2016-01-26 NOTE — Progress Notes (Signed)
Patient ID: Bethany Fuller, female   DOB: April 10, 1946, 70 y.o.   MRN: [id number]     Primary MD:  Dr. Nelda Bucks  PATIENT PROFILE: Bethany Fuller is a 70 y.o. female  Who is referred through the courtesy of Dr. Ilda Basset for evaluation of significant recent blood pressure lability as well as chest tightness.  I saw her for initial evaluation one month ago.  She presents for follow-up evaluation.  HPI:  Bethany Fuller  Admits to at least a 10 year history of hypertension. Remotely, she had been on felodipine for blood pressure control  And she states that she ultimately was taken off this therapy and had been off this for the past year. She recently had been evaluated and her blood pressure was 162/107 and then when she saw physician at [facility].  Her blood pressure was 152/101. She was recently evaluated I, Dr. Delena Bali on 12/11/2015 and felodipine was reinstituted. Patient had also complained of some mild headaches and dizziness.  He has a history of tobacco use , family history for CAD, as well as hyperlipidemia. She describes her recent chest pain as a tightness associated with shortness of breath with mild radiation to her neck and shoulders her symptoms can occur at any time and are not classically exertionally precipitated. She has had significant difficulty with her mouth and had recently seen a neurologist for facial pain.  When I saw her, her ECG was unremarkable.  She underwent laboratory which revealed a normal CBC and chemistry profile.  Her lipid studies revealed mild triglyceride elevation at 184 with a total cholesterol 147, LDL cholesterol 64 and HDL cholesterol 46.  I recommended that we discontinue simvastatin 80 mg but for equal potency changed her to atorvastatin 40 mg, since the element 80 mg dose is no longer recommended and with simvastatin.  She may have dose limitations based on concomitant therapy.  I initiated metoprolol 25 mg twice a day and  recommended that she undergo an echo Doppler study and Myoview scan.  The echo Doppler study from 01/07/2016 showed an EF of 60-65%.  There was grade 2 diastolic dysfunction with normal wall motion.  There was aortic valve sclerosis.  Her nuclear study did not reveal any ST segment changes.  An atrial septal and inferolateral defect was noted, but this was felt most likely due to attenuation artifact.  There was no ischemia.  She had normal wall motion with an ejection fraction of 58%.  The study was interpreted as low risk.  She states that she had been taking the felodipine but ran out several days ago.  She presents for reevaluation.  Additional medical problems include history of iron deficiency anemia, seasonal rhinitis, degenerative disease of her cervical vertebral region, osteoarthritis, depression, restless leg syndrome, and urinary incontinence.  Past Medical History  Diagnosis Date  . Family history of anesthesia complication     Mother- nausea  . Hypertension   . Headache(784.0)     Miagrains- now only once a year. Has light migranies from Dystonia  . Shortness of breath     with extertion   . Pneumonia     2011  . Depression   . GERD (gastroesophageal reflux disease)   . Constipation   . Arthritis   . Wears hearing aid     right  . Cervical dystonia     Past Surgical History  Procedure Laterality Date  . Abdominal hysterectomy    . Cervical fusion  4, 5, 6  . Excision of tongue lesion Right 06/28/2014    Procedure: WIDE LOCAL EXCISION OF THE RIGHT LATERAL TONGUE MASS;  Surgeon: Jerrell Belfast, MD;  Location: [place];  Service: ENT;  Laterality: Right;    Allergies  Allergen Reactions  . Morphine And Related Itching and Rash  . Sulfa Antibiotics Itching and Rash    Current Outpatient Prescriptions  Medication Sig Dispense Refill  . atorvastatin (LIPITOR) 40 MG tablet Take 1 tablet (40 mg total) by mouth daily. 30 tablet 6  . baclofen (LIORESAL) 10 MG tablet  Take 20 mg by mouth 3 (three) times daily.    . calcium carbonate (OS-CAL) 600 MG TABS tablet Take 1,200 mg by mouth daily.    . Calcium-Magnesium-Vitamin D (CALCIUM 1200+D3 PO) Take 1 tablet by mouth daily.    . CHANTIX 1 MG tablet Take 1 mg by mouth 2 (two) times daily.  5  . Cholecalciferol (VITAMIN D-3) 5000 UNITS TABS Take 5,000 Units by mouth daily.    . clonazePAM (KLONOPIN) 0.5 MG tablet Take 1.5 mg by mouth 3 (three) times daily.    Marland Kitchen gabapentin (NEURONTIN) 300 MG capsule Take 300-900 mg by mouth 3 (three) times daily. 600 mg in the morning, 300 mg at lunch and 1200 mg at night    . meclizine (ANTIVERT) 25 MG tablet Take 25 mg by mouth daily. Scheduled 1 time a day.    . meloxicam (MOBIC) 15 MG tablet Take 15 mg by mouth daily.    . Multiple Vitamin (MULTIVITAMIN WITH MINERALS) TABS tablet Take 1 tablet by mouth daily.    . Multiple Vitamins-Minerals (HAIR/SKIN/NAILS PO) Take 3 tablets by mouth daily.    . Omega-3 Fatty Acids (FISH OIL) 1200 MG CAPS Take 1,200 mg by mouth daily.    Marland Kitchen omeprazole (PRILOSEC) 20 MG capsule Take 20 mg by mouth daily.    . OnabotulinumtoxinA (BOTOX IJ) Inject as directed every 3 (three) months.    . pilocarpine (SALAGEN) 5 MG tablet Take 5 mg by mouth 3 (three) times daily.  12  . polyethylene glycol (MIRALAX / GLYCOLAX) packet Take 17 g by mouth as needed.    . Pyridoxine HCl (VITAMIN B-6 PO) Take 1 tablet by mouth daily.    Marland Kitchen rOPINIRole (REQUIP) 0.25 MG tablet Take 1 mg by mouth at bedtime.    . sertraline (ZOLOFT) 100 MG tablet Take 150 mg by mouth daily.    Marland Kitchen tiZANidine (ZANAFLEX) 4 MG tablet Take 1 tablet by mouth at bedtime.    . vitamin E 1000 UNIT capsule Take 1,000 Units by mouth daily.    Marland Kitchen amLODipine (NORVASC) 5 MG tablet Take 1/2-1 tablet daily per Dr Evette Georges recommendatoins. 30 tablet 6   No current facility-administered medications for this visit.    Social History   Social History  . Marital Status: Divorced    Spouse Name: N/A  .  Number of Children: N/A  . Years of Education: N/A   Occupational History  . Not on file.   Social History Main Topics  . Smoking status: Former Smoker -- 35 years  . Smokeless tobacco: Not on file  . Alcohol Use: No  . Drug Use: No  . Sexual Activity: Yes    Birth Control/ Protection: Patch     Comment: quit smoking10 /2014   Other Topics Concern  . Not on file   Social History Narrative   Additional social history is notable and that she is divorced for many  years.  She has one child one grandchild. She lives with her mother and brother. She has been on disability. She has been smoking intermittently for 20-30 years.  She does not drink alcohol.  She does not routinely exercise.   Family history is notable that her mother is living at age 40 and has diabetes.  Father died at age 52 and had dementia, Parkinson's disease and COPD.  Her brother is 33 and has a neuropathy.  Her child is 60.  ROS General: Negative; No fevers, chills, or night sweats HEENT:  Positive for facial pain; No changes in vision or hearing, sinus congestion, difficulty swallowing Pulmonary: Negative; No cough, wheezing, shortness of breath, hemoptysis Cardiovascular:  See HPI;  GI:  Positive for GERD GU: Negative; No dysuria, hematuria, or difficulty voiding Musculoskeletal:  Positive for cervical disc disease and arthritis. Hematologic/Oncologic: Negative; no easy bruising, bleeding Endocrine: Negative; no heat/cold intolerance; no diabetes Neuro: Negative; no changes in balance, headaches Skin: Negative; No rashes or skin lesions Psychiatric:  Positive for anxiety Sleep: Negative; No daytime sleepiness, hypersomnolence, bruxism, restless legs, hypnogagnic hallucinations Other comprehensive 14 point system review is negative   Physical Exam BP 124/79 mmHg  Pulse 67  Ht 5' 3"  (1.6 m)  Wt 181 lb (82.101 kg)  BMI 32.07 kg/m2   Repeat blood pressure 116/79  Wt Readings from Last 3 Encounters:    01/24/16 181 lb (82.101 kg)  12/17/15 175 lb 3.2 oz (79.47 kg)  06/28/14 209 lb 3 oz (94.887 kg)   General: Alert, oriented, no distress.  Skin: normal turgor, no rashes, warm and dry HEENT: Normocephalic, atraumatic. Pupils equal round and reactive to light; sclera anicteric; extraocular muscles intact; Fundi without hemorrhages or exudates Nose without nasal septal hypertrophy Mouth/Parynx benign; Mallinpatti scale 2/3 Neck: No JVD, no carotid bruits; normal carotid upstroke Lungs: clear to ausculatation and percussion; no wheezing or rales Chest wall: without tenderness to palpitation Heart: PMI not displaced, RRR, s1 s2 normal, 1/6 systolic murmur, no diastolic murmur, no rubs, gallops, thrills, or heaves Abdomen: soft, nontender; no hepatosplenomehaly, BS+; abdominal aorta nontender and not dilated by palpation. Back: no CVA tenderness Pulses 2+ Musculoskeletal: full range of motion, normal strength, no joint deformities Extremities: no clubbing cyanosis or edema, Homan's sign negative  Neurologic: grossly nonfocal; Cranial nerves grossly wnl Psychologic: Normal mood and affect  No new ECG done today.  12/17/2015 ECG (independently read by me):  Normal sinus rhythm at 81 bpm.  Normal intervals.  No ST segment changes.  LABS:  BMP Latest Ref Rng 01/08/2016 06/27/2014  Glucose 65 - 99 mg/dL 74 92  BUN 7 - 25 mg/dL 13 11  Creatinine 0.50 - 0.99 mg/dL 0.64 0.65  Sodium 135 - 146 mmol/L 139 143  Potassium 3.5 - 5.3 mmol/L 3.9 4.2  Chloride 98 - 110 mmol/L 99 103  CO2 20 - 31 mmol/L 30 29  Calcium 8.6 - 10.4 mg/dL 9.5 9.8    Hepatic Function Latest Ref Rng 01/08/2016  Total Protein 6.1 - 8.1 g/dL 6.6  Albumin 3.6 - 5.1 g/dL 4.5  AST 10 - 35 U/L 24  ALT 6 - 29 U/L 29  Alk Phosphatase 33 - 130 U/L 63  Total Bilirubin 0.2 - 1.2 mg/dL 0.7    CBC Latest Ref Rng 01/08/2016 06/27/2014  WBC 3.8 - 10.8 K/uL 5.6 6.3  Hemoglobin 11.7 - 15.5 g/dL 14.7 13.9  Hematocrit 35.0 - 45.0 %  43.5 40.9  Platelets 140 - 400 K/uL 173 138(L)  Lab Results  Component Value Date   MCV 91.4 01/08/2016   MCV 89.5 06/27/2014   Lab Results  Component Value Date   TSH 0.84 01/08/2016   No results found for: HGBA1C   BNP No results found for: BNP  ProBNP No results found for: PROBNP   Lipid Panel     Component Value Date/Time   CHOL 147 01/08/2016 1102   TRIG 184* 01/08/2016 1102   HDL 46 01/08/2016 1102   CHOLHDL 3.2 01/08/2016 1102   VLDL 37* 01/08/2016 1102   LDLCALC 64 01/08/2016 1102    RADIOLOGY: No results found.   ASSESSMENT AND PLAN:  Ms. Bethany Fuller is a 70 year old female who has at least a 10 year history of hypertension and remotely had been treated with felodipine long-term.  She had been off therapy for almost a year and recently developed recurrent blood pressure lability.  When I saw her initially her felodipine had been resumed by her primary physician and her blood pressure was better.  He had experienced episodes of chest pressure-like weight sitting on her chest.  Her echo Doppler study shows normal systolic function with grade 2 diastolic dysfunction, which may contribute to exertional dyspnea.  Her nuclear perfusion study is low risk and the defect is most likely consistent with breast attenuation artifact.  She had normal wall motion.  There was no ischemia.  I have recommended she continue with calcium channel blockade.  Since she has run out of felodipine.  I am starting her on amlodipine, which initially will be 2.5 mg daily.  If she notes her blood pressure is greater than 215 systolically.  Her greater than 90 diastolically.  She will increase this to 5 mg daily.  I reviewed her recent blood work.  When I last saw her , I changed her simvastatin 80 mg to atorvastatin 40 mg.  She is tolerating atorvastatin well and lipid studies are excellent.  She has GERD and this is controlled with omeprazole.  I will see her in 6 months for  reevaluation.  Time spent: 25 minutes  Troy Sine, MD, [facility] 01/26/2016 2:12 PM

## 2016-01-28 DIAGNOSIS — Z87891 Personal history of nicotine dependence: Secondary | ICD-10-CM | POA: Diagnosis not present

## 2016-01-28 DIAGNOSIS — I10 Essential (primary) hypertension: Secondary | ICD-10-CM | POA: Diagnosis not present

## 2016-01-28 DIAGNOSIS — G248 Other dystonia: Secondary | ICD-10-CM | POA: Diagnosis not present

## 2016-01-28 DIAGNOSIS — G243 Spasmodic torticollis: Secondary | ICD-10-CM | POA: Diagnosis not present

## 2016-02-13 DIAGNOSIS — Z1389 Encounter for screening for other disorder: Secondary | ICD-10-CM | POA: Diagnosis not present

## 2016-02-13 DIAGNOSIS — M8589 Other specified disorders of bone density and structure, multiple sites: Secondary | ICD-10-CM | POA: Diagnosis not present

## 2016-02-13 DIAGNOSIS — E669 Obesity, unspecified: Secondary | ICD-10-CM | POA: Diagnosis not present

## 2016-02-13 DIAGNOSIS — Z6832 Body mass index (BMI) 32.0-32.9, adult: Secondary | ICD-10-CM | POA: Diagnosis not present

## 2016-02-13 DIAGNOSIS — I10 Essential (primary) hypertension: Secondary | ICD-10-CM | POA: Diagnosis not present

## 2016-02-13 DIAGNOSIS — D509 Iron deficiency anemia, unspecified: Secondary | ICD-10-CM | POA: Diagnosis not present

## 2016-02-13 DIAGNOSIS — E559 Vitamin D deficiency, unspecified: Secondary | ICD-10-CM | POA: Diagnosis not present

## 2016-02-13 DIAGNOSIS — E785 Hyperlipidemia, unspecified: Secondary | ICD-10-CM | POA: Diagnosis not present

## 2016-02-20 DIAGNOSIS — M85851 Other specified disorders of bone density and structure, right thigh: Secondary | ICD-10-CM | POA: Diagnosis not present

## 2016-02-20 DIAGNOSIS — M8589 Other specified disorders of bone density and structure, multiple sites: Secondary | ICD-10-CM | POA: Diagnosis not present

## 2016-03-06 DIAGNOSIS — B370 Candidal stomatitis: Secondary | ICD-10-CM | POA: Diagnosis not present

## 2016-03-06 DIAGNOSIS — B372 Candidiasis of skin and nail: Secondary | ICD-10-CM | POA: Diagnosis not present

## 2016-03-06 DIAGNOSIS — Z6833 Body mass index (BMI) 33.0-33.9, adult: Secondary | ICD-10-CM | POA: Diagnosis not present

## 2016-03-31 DIAGNOSIS — J208 Acute bronchitis due to other specified organisms: Secondary | ICD-10-CM | POA: Diagnosis not present

## 2016-03-31 DIAGNOSIS — Z6832 Body mass index (BMI) 32.0-32.9, adult: Secondary | ICD-10-CM | POA: Diagnosis not present

## 2016-03-31 DIAGNOSIS — R05 Cough: Secondary | ICD-10-CM | POA: Diagnosis not present

## 2016-04-28 DIAGNOSIS — M542 Cervicalgia: Secondary | ICD-10-CM | POA: Diagnosis not present

## 2016-04-28 DIAGNOSIS — Z981 Arthrodesis status: Secondary | ICD-10-CM | POA: Diagnosis not present

## 2016-04-28 DIAGNOSIS — I10 Essential (primary) hypertension: Secondary | ICD-10-CM | POA: Diagnosis not present

## 2016-04-28 DIAGNOSIS — G243 Spasmodic torticollis: Secondary | ICD-10-CM | POA: Diagnosis not present

## 2016-04-28 DIAGNOSIS — Z87891 Personal history of nicotine dependence: Secondary | ICD-10-CM | POA: Diagnosis not present

## 2016-06-25 ENCOUNTER — Ambulatory Visit: Payer: Medicare Other | Attending: Psychiatry

## 2016-06-25 DIAGNOSIS — R2689 Other abnormalities of gait and mobility: Secondary | ICD-10-CM | POA: Diagnosis not present

## 2016-06-25 DIAGNOSIS — M6281 Muscle weakness (generalized): Secondary | ICD-10-CM | POA: Diagnosis not present

## 2016-06-25 DIAGNOSIS — M542 Cervicalgia: Secondary | ICD-10-CM | POA: Diagnosis not present

## 2016-06-25 NOTE — Therapy (Signed)
[facility] [address] [place], [place], [address] Phone: [phone]   Fax:  [phone]  Physical Therapy Evaluation  Patient Details  Name: Bethany Fuller MRN: [id number] Date of Birth: 03/11/1946 Referring Provider: Dr. Seward Meth  Encounter Date: 06/25/2016      PT End of Session - 06/25/16 1515    Visit Number 1   Number of Visits 17   Date for PT Re-Evaluation 08/24/16   Authorization Type Blue Medicare: G-CODE AND PROGRESS NOTE EVERY 10TH VISIT.   PT Start Time 1325   PT Stop Time 1405   PT Time Calculation (min) 40 min   Equipment Utilized During Treatment --  min guard to S prn   Activity Tolerance Patient tolerated treatment well   Behavior During Therapy WFL for tasks assessed/performed      Past Medical History:  Diagnosis Date  . Arthritis   . Cervical dystonia   . Constipation   . Depression   . Family history of anesthesia complication    Mother- nausea  . GERD (gastroesophageal reflux disease)   . Headache(784.0)    Miagrains- now only once a year. Has light migranies from Dystonia  . Hypertension   . Pneumonia    2011  . Shortness of breath    with extertion   . Wears hearing aid    right    Past Surgical History:  Procedure Laterality Date  . ABDOMINAL HYSTERECTOMY    . CERVICAL FUSION     4, 5, 6  . EXCISION OF TONGUE LESION Right 06/28/2014   Procedure: WIDE LOCAL EXCISION OF THE RIGHT LATERAL TONGUE MASS;  Surgeon: Osborn Coho, MD;  Location: [facility] OR;  Service: ENT;  Laterality: Right;    There were no vitals filed for this visit.       Subjective Assessment - 06/25/16 1340    Subjective Pt receives botox for cervical dystonia every 3 months and her neck pain was worse this last time. Pt reports her balance is also now impaired. Pt feels like she falls backwards vs. forwards, pt has fallen approx. 5-6 times in the last year but has not fallen in last few months.  Pt describes it as unsteadiness. Pt has hard time getting up off the floor.    Pertinent History "Goes by Bethany Fuller". HTN, DDD, hyperlipidemia, HOH (R hearing aide), Cx spine fusion and diskectomy in 2010, RLS, spondylolistheis of Cx region   Limitations Other (comment)  traversing stairs, squatting, and stepping over obstacles.   Patient Stated Goals Get my balance better, and improve neck ROM (if possible) as pt believes neck impairs her balance. Walk straight.    Currently in Pain? No/denies            [facility] PT Assessment - 06/25/16 1343      Assessment   Medical Diagnosis Cervical dystonia and cerebellar ataxia   Referring Provider Dr. Seward Meth   Onset Date/Surgical Date 06/26/15  with pt reporting it began with cx dystonia dx in 2006.   Hand Dominance Right   Next MD Visit botox every 3 months   Prior Therapy none for neck     Precautions   Precautions Fall  cx AROM limited 2/2 cx spine fusion     Restrictions   Weight Bearing Restrictions No     Balance Screen   Has the patient fallen in the past 6 months Yes   How many times? 5-6  in last year   Has the patient  had a decrease in activity level because of a fear of falling?  No   Is the patient reluctant to leave their home because of a fear of falling?  No     Home Environment   Living Environment Private residence   Living Arrangements Parent  lives with her mother   Available Help at Discharge Family   Type of Home House   Home Access Level entry   Home Layout One level   Home Equipment Bedside commode;Shower seat     Prior Function   Level of Independence Independent   Vocation Retired   Leisure travel, read, visit with family and friends     Cognition   Overall Cognitive Status Impaired/Different from baseline   Memory Appears intact  but pt reports short term memory deficits     Sensation   Light Touch Appears Intact     Coordination   Gross Motor Movements are Fluid and Coordinated Yes      Posture/Postural Control   Posture/Postural Control Postural limitations   Postural Limitations Forward head     ROM / Strength   AROM / PROM / Strength AROM;Strength     AROM   Overall AROM  Deficits   Overall AROM Comments B UE/LE AROM WFL. Pt's cervical AROM limited 2/2 C4-C6 ant. cx diskectomy and fusion in 06/2009.     Strength   Overall Strength Deficits   Overall Strength Comments B UE strength WFL. B hip flex: 3+/5, B knee ext: 4/5, B knee flex: 3+/5, ankle DF: 4/5. Gross hip abd/add: 3+/5 in seated position. Hip ext. weakness suspected based on pt's gait deviations.     Transfers   Transfers Sit to Stand;Stand to Sit   Sit to Stand 5: Supervision;With upper extremity assist;From chair/3-in-1   Stand to Sit 5: Supervision;With upper extremity assist;To chair/3-in-1     Ambulation/Gait   Ambulation/Gait Yes   Ambulation/Gait Assistance 5: Supervision   Ambulation/Gait Assistance Details No overt LOB but incr. postural sway during turns.   Ambulation Distance (Feet) 200 Feet   Assistive device None   Gait Pattern Step-through pattern;Decreased stride length;Decreased trunk rotation;Decreased weight shift to left   Ambulation Surface Level;Indoor   Gait velocity 2.62ft/sec.   Stairs Yes   Stairs Assistance 5: Supervision   Stair Management Technique One rail Right;One rail Left;Alternating pattern;Forwards   Number of Stairs 4   Height of Stairs 6     Standardized Balance Assessment   Standardized Balance Assessment Dynamic Gait Index     Dynamic Gait Index   Level Surface Mild Impairment   Change in Gait Speed Mild Impairment   Gait with Horizontal Head Turns Mild Impairment   Gait with Vertical Head Turns Mild Impairment   Gait and Pivot Turn Mild Impairment   Step Over Obstacle Moderate Impairment   Step Around Obstacles Mild Impairment   Steps Mild Impairment   Total Score 15                           PT Education - 06/25/16 1514     Education provided Yes   Education Details PT discussed frequency/duration and outcome meausre results.    Person(s) Educated Patient   Methods Explanation   Comprehension Verbalized understanding          PT Short Term Goals - 06/25/16 1523      PT SHORT TERM GOAL #1   Title Pt will be IND in HEP to  improve balance, strength, and flexibility. TARGET DATE FOR ALL STGS: 07/23/16   Status New     PT SHORT TERM GOAL #2   Title Pt will verbalize understanding of falls risk prevention strategies to reduce falls risk.   Status New     PT SHORT TERM GOAL #3   Title Assess dizziness and cervical spine and write goals prn.    Status New     PT SHORT TERM GOAL #4   Title Pt will improve DGI score to >/=17/24 to reduce falls risk.    Status New     PT SHORT TERM GOAL #5   Title Pt will traverse 4 steps without handrails at MOD I level (decr. speed) to improve functional mobility.    Status New           PT Long Term Goals - 06/25/16 1525      PT LONG TERM GOAL #1   Title Pt will improve DGI score to >/=20/24 to decr. falls risk. TARGET DATE FOR ALL LTGS: 08/20/16   Status New     PT LONG TERM GOAL #2   Title Pt will amb. 700' over even /uneven terrain, while performing head turns, without incr. in dizziness IND to improve functional mobility.   Status New     PT LONG TERM GOAL #3   Title Pt will be able to safely perform squatting activities and stepping over obstacles to perform ADLs IND without LOB.   Status New               Plan - 06/25/16 1517    Clinical Impression Statement Pt is a pleasant 70y/o female presenting to OPPT neuro with cervical dystonia and balance issues. MD note diagnoses included cervical dystonia and cerebellar ataxia. Ataxia was not noted during PT exam but PT will continue to monitor. Pt's PMH significant for the following: HTN, DDD, hyperlipidemia, HOH (R hearing aide), Cx spine fusion and diskectomy in 2010, RLS, spondylolistheis of Cx  region. Pt did not experience pain during eval but reported intermittent LBP with pain radiating to LLE in the morning, PT will not directly address LBP but will closely monitor. Pt presented with the following deficits: gait deviations, impaired balance, impaired cervical AROM (will formally assess next session, as limited today 2/2 time constraints), dizziness (but not experienced during session), impaired strength, and intermittent cervical spine pain. Pt's DGI score indicates pt is at risk for falls. PT will formally assess pt's neck musculature and cx spine ROM next session, along with pt's c/o intermittent dizziness.    Rehab Potential Good   Clinical Impairments Affecting Rehab Potential co-morbidities and hx of dizziness.    PT Frequency 2x / week   PT Duration 8 weeks   PT Treatment/Interventions ADLs/Self Care Home Management;Biofeedback;Canalith Repostioning;Electrical Stimulation;Ultrasound;Neuromuscular re-education;Balance training;Passive range of motion;Therapeutic exercise;Therapeutic activities;Manual techniques;Functional mobility training;Stair training;Gait training;DME Instruction;Orthotic Fit/Training;Patient/family education;Vestibular   PT Next Visit Plan Assess cx spine AROM and cervical musculature, assess for dizziness. Initiate balance/strengthening HEP.   Consulted and Agree with Plan of Care Patient      Patient will benefit from skilled therapeutic intervention in order to improve the following deficits and impairments:  Abnormal gait, Decreased endurance, Pain, Dizziness, Decreased range of motion, Impaired flexibility, Postural dysfunction, Decreased balance, Decreased mobility, Decreased knowledge of use of DME, Decreased strength  Visit Diagnosis: Other abnormalities of gait and mobility - Plan: PT plan of care cert/re-cert  Muscle weakness (generalized) - Plan: PT plan of care cert/re-cert  Cervical pain - Plan: PT plan of care cert/re-cert      G-Codes -  06/25/16 1527    Functional Assessment Tool Used DGI: 15/24   Functional Limitation Mobility: Walking and moving around   Mobility: Walking and Moving Around Current Status [phone]([id number]) At least 40 percent but less than 60 percent impaired, limited or restricted   Mobility: Walking and Moving Around Goal Status [phone]([id number]) At least 1 percent but less than 20 percent impaired, limited or restricted       Problem List Patient Active Problem List   Diagnosis Date Noted  . Chest tightness or pressure 12/18/2015  . Essential hypertension 12/18/2015  . GERD (gastroesophageal reflux disease) 12/18/2015  . Hyperlipidemia 12/18/2015  . Tobacco abuse counseling 12/18/2015  . Degenerative arthritis of cervical spine 12/18/2015  . Tongue mass 06/28/2014    Asyia Hornung L 06/25/2016, 3:28 PM  [facility] [address] [place], [place], [address] Phone: [phone]   Fax:  [phone]  Name: Bethany RungBarbara Harriet Fuller MRN: [id number] Date of Birth: [birth year]  Zerita BoersJennifer Wolfe Camarena, PT,DPT 06/25/16 3:29 PM Phone: [phone] Fax: [phone]

## 2016-07-01 ENCOUNTER — Other Ambulatory Visit: Payer: Self-pay | Admitting: Cardiovascular Disease

## 2016-07-03 ENCOUNTER — Ambulatory Visit: Payer: Medicare Other | Attending: Psychiatry | Admitting: Physical Therapy

## 2016-07-03 DIAGNOSIS — M542 Cervicalgia: Secondary | ICD-10-CM | POA: Diagnosis not present

## 2016-07-03 DIAGNOSIS — R2689 Other abnormalities of gait and mobility: Secondary | ICD-10-CM | POA: Insufficient documentation

## 2016-07-03 DIAGNOSIS — M6281 Muscle weakness (generalized): Secondary | ICD-10-CM | POA: Insufficient documentation

## 2016-07-03 NOTE — Therapy (Signed)
[facility] [address] [place], [place], [address] Phone: [phone]   Fax:  [phone]  Physical Therapy Treatment  Patient Details  Name: Bethany Fuller MRN: [id number] Date of Birth: [birth year] Referring Provider: Dr. Seward MethBrowner  Encounter Date: 07/03/2016      PT End of Session - 07/03/16 1401    Visit Number 2   Number of Visits 17   Date for PT Re-Evaluation 08/24/16   Authorization Type Blue Medicare: G-CODE AND PROGRESS NOTE EVERY 10TH VISIT.   PT Start Time 1315   PT Stop Time 1400   PT Time Calculation (min) 45 min   Equipment Utilized During Treatment --  min guard to S prn   Activity Tolerance Patient tolerated treatment well   Behavior During Therapy WFL for tasks assessed/performed      Past Medical History:  Diagnosis Date  . Arthritis   . Cervical dystonia   . Constipation   . Depression   . Family history of anesthesia complication    Mother- nausea  . GERD (gastroesophageal reflux disease)   . Headache(784.0)    Miagrains- now only once a year. Has light migranies from Dystonia  . Hypertension   . Pneumonia    2011  . Shortness of breath    with extertion   . Wears hearing aid    right    Past Surgical History:  Procedure Laterality Date  . ABDOMINAL HYSTERECTOMY    . CERVICAL FUSION     4, 5, 6  . EXCISION OF TONGUE LESION Right 06/28/2014   Procedure: WIDE LOCAL EXCISION OF THE RIGHT LATERAL TONGUE MASS;  Surgeon: Osborn Cohoavid Shoemaker, MD;  [facility];  Service: ENT;  Laterality: Right;    There were no vitals filed for this visit.      Subjective Assessment - 07/03/16 1320    Subjective No falls since last visits. 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 Sedentary lifestyle the last couple of years possibly from recent medical issues.   Pertinent History "Goes by Bethany Fuller". HTN, DDD, hyperlipidemia, HOH (R hearing aide), Cx spine fusion and diskectomy in 2010, RLS,  spondylolistheis of Cx region   Limitations Other (comment)  traversing stairs, squatting, and stepping over obstacles.   Patient Stated Goals Get my balance better, and improve neck ROM (if possible) as pt believes neck impairs her balance. Walk straight.    Currently in Pain? No/denies                         [facility] Adult PT Treatment/Exercise - 07/03/16 0001      Ambulation/Gait   Ambulation/Gait Yes   Ambulation/Gait Assistance 5: Supervision   Ambulation/Gait Assistance Details training for activity tolerance   Ambulation Distance (Feet) 400 Feet  about 3 min and pt requested seated rest.   Assistive device None   Gait Pattern Step-through pattern;Decreased stride length;Decreased trunk rotation;Decreased weight shift to left;Trendelenburg   Ambulation Surface Level;Indoor     Exercises   Exercises Knee/Hip;Lumbar  See pt instruction; performed with min cues and no cervical pain but some strain in LLEl              Balance Exercises - 07/03/16 1408      Balance Exercises: Standing   SLS Eyes open;Intermittent upper extremity support;3 reps;15 secs  cues for technique           PT Education - 07/03/16 1400    Education provided Yes  Education Details HEP for standing balance, LE strength, and walking program   Person(s) Educated Patient   Methods Explanation;Demonstration;Verbal cues;Handout   Comprehension Verbalized understanding;Returned demonstration;Need further instruction          PT Short Term Goals - 06/25/16 1523      PT SHORT TERM GOAL #1   Title Pt will be IND in HEP to improve balance, strength, and flexibility. TARGET DATE FOR ALL STGS: 07/23/16   Status New     PT SHORT TERM GOAL #2   Title Pt will verbalize understanding of falls risk prevention strategies to reduce falls risk.   Status New     PT SHORT TERM GOAL #3   Title Assess dizziness and cervical spine and write goals prn.    Status New     PT SHORT TERM  GOAL #4   Title Pt will improve DGI score to >/=17/24 to reduce falls risk.    Status New     PT SHORT TERM GOAL #5   Title Pt will traverse 4 steps without handrails at MOD I level (decr. speed) to improve functional mobility.    Status New           PT Long Term Goals - 06/25/16 1525      PT LONG TERM GOAL #1   Title Pt will improve DGI score to >/=20/24 to decr. falls risk. TARGET DATE FOR ALL LTGS: 08/20/16   Status New     PT LONG TERM GOAL #2   Title Pt will amb. 700' over even /uneven terrain, while performing head turns, without incr. in dizziness IND to improve functional mobility.   Status New     PT LONG TERM GOAL #3   Title Pt will be able to safely perform squatting activities and stepping over obstacles to perform ADLs IND without LOB.   Status New               Plan - 07/03/16 1411    Clinical Impression Statement Initiated HEP for walking, LE strength, and standing balance; pt reported  being challenged but had no cervical pain, did report some strain in LLE.  Noted pt having imbalance and difficulty with obstacle negotiation during gait.   Rehab Potential Good   Clinical Impairments Affecting Rehab Potential co-morbidities and hx of dizziness.    PT Frequency 2x / week   PT Duration 8 weeks   PT Treatment/Interventions ADLs/Self Care Home Management;Biofeedback;Canalith Repostioning;Electrical Stimulation;Ultrasound;Neuromuscular re-education;Balance training;Passive range of motion;Therapeutic exercise;Therapeutic activities;Manual techniques;Functional mobility training;Stair training;Gait training;DME Instruction;Orthotic Fit/Training;Patient/family education;Vestibular   PT Next Visit Plan Dynamic gait and obstacle negotiation; Assess cx spine AROM and cervical musculature, assess for dizziness. Review balance/strengthening HEP.   Consulted and Agree with Plan of Care Patient      Patient will benefit from skilled therapeutic intervention in order  to improve the following deficits and impairments:  Abnormal gait, Decreased endurance, Pain, Dizziness, Decreased range of motion, Impaired flexibility, Postural dysfunction, Decreased balance, Decreased mobility, Decreased knowledge of use of DME, Decreased strength  Visit Diagnosis: Other abnormalities of gait and mobility  Muscle weakness (generalized)     Problem List Patient Active Problem List   Diagnosis Date Noted  . Chest tightness or pressure 12/18/2015  . Essential hypertension 12/18/2015  . GERD (gastroesophageal reflux disease) 12/18/2015  . Hyperlipidemia 12/18/2015  . Tobacco abuse counseling 12/18/2015  . Degenerative arthritis of cervical spine 12/18/2015  . Tongue mass 06/28/2014   Hortencia Conradi, PTA  07/03/16, 2:16 PM  [facility] [address] [place], [place], [address] Phone: [phone]   Fax:  [phone]  Name: Delmar Dondero MRN: [id number] Date of Birth: [birth year]

## 2016-07-03 NOTE — Patient Instructions (Addendum)
Bridging    Slowly raise buttocks from floor, keeping stomach tight. Repeat _10___ times per set. Do _2___ sets per session. Do __1-2__ sessions per day.  http://orth.exer.us/1097   Copyright  VHI. All rights reserved.  Functional Quadriceps: Sit to Stand    Sit on edge of chair, feet flat on floor. Stand upright, extending knees fully. Repeat _10___ times per set. Do __1__ sets per session. Do _1-2___ sessions per day.  http://orth.exer.us/735   Copyright  VHI. All rights reserved.  Single Leg - Eyes Open    Holding support, lift right leg while maintaining balance over other leg. Progress to removing hands from support surface for longer periods of time. Hold__15__ seconds. Repeat __3__ times per session. Do _1-2___ sessions per day.  Copyright  VHI. All rights reserved.    

## 2016-07-09 ENCOUNTER — Ambulatory Visit: Payer: Medicare Other | Admitting: Physical Therapy

## 2016-07-09 DIAGNOSIS — M6281 Muscle weakness (generalized): Secondary | ICD-10-CM

## 2016-07-09 DIAGNOSIS — M542 Cervicalgia: Secondary | ICD-10-CM

## 2016-07-09 DIAGNOSIS — R2689 Other abnormalities of gait and mobility: Secondary | ICD-10-CM | POA: Diagnosis not present

## 2016-07-09 NOTE — Patient Instructions (Signed)
Bridging    Slowly raise buttocks from floor, keeping stomach tight. Repeat _10___ times per set. Do _2___ sets per session. Do __1-2__ sessions per day.  http://orth.exer.us/1097   Copyright  VHI. All rights reserved.  Functional Quadriceps: Sit to Stand    Sit on edge of chair, feet flat on floor. Stand upright, extending knees fully. Repeat _10___ times per set. Do __1__ sets per session. Do _1-2___ sessions per day.  http://orth.exer.us/735   Copyright  VHI. All rights reserved.  Single Leg - Eyes Open    Holding support, lift right leg while maintaining balance over other leg. Progress to removing hands from support surface for longer periods of time. Hold__15__ seconds. Repeat __3__ times per session. Do _1-2___ sessions per day.  Copyright  VHI. All rights reserved.

## 2016-07-09 NOTE — Therapy (Deleted)
[facility] [address] [place], [place], [address] Phone: [phone]   Fax:  [phone]  Physical Therapy Treatment  Patient Details  Name: Bethany RungBarbara Harriet Fuller MRN: [id number] Date of Birth: Sep 13, 1946 Referring Provider: Dr. Seward MethBrowner  Encounter Date: 07/09/2016      PT End of Session - 07/09/16 1442    Visit Number 3   Number of Visits 17   Date for PT Re-Evaluation 08/24/16   Authorization Type Blue Medicare: G-CODE AND PROGRESS NOTE EVERY 10TH VISIT.   PT Start Time 1405   PT Stop Time 1443   PT Time Calculation (min) 38 min   Equipment Utilized During Treatment --  min guard to S prn   Activity Tolerance Patient tolerated treatment well   Behavior During Therapy WFL for tasks assessed/performed      Past Medical History:  Diagnosis Date  . Arthritis   . Cervical dystonia   . Constipation   . Depression   . Family history of anesthesia complication    Mother- nausea  . GERD (gastroesophageal reflux disease)   . Headache(784.0)    Miagrains- now only once a year. Has light migranies from Dystonia  . Hypertension   . Pneumonia    2011  . Shortness of breath    with extertion   . Wears hearing aid    right    Past Surgical History:  Procedure Laterality Date  . ABDOMINAL HYSTERECTOMY    . CERVICAL FUSION     4, 5, 6  . EXCISION OF TONGUE LESION Right 06/28/2014   Procedure: WIDE LOCAL EXCISION OF THE RIGHT LATERAL TONGUE MASS;  Surgeon: Osborn Cohoavid Shoemaker, MD;  [facility];  Service: ENT;  Laterality: Right;    There were no vitals filed for this visit.      Subjective Assessment - 07/09/16 1406    Subjective Pt did some of the HEP since last visit.   Pertinent History "Goes by Bethany MccreedyBarbara". HTN, DDD, hyperlipidemia, HOH (R hearing aide), Cx spine fusion and diskectomy in 2010, RLS, spondylolistheis of Cx region   Limitations Other (comment)  traversing stairs, squatting, and stepping  over obstacles.   Patient Stated Goals Get my balance better, and improve neck ROM (if possible) as pt believes neck impairs her balance. Walk straight.    Currently in Pain? Yes   Pain Score 5    Pain Location Leg   Pain Orientation Left   Pain Descriptors / Indicators Aching   Pain Type Chronic pain   Pain Radiating Towards Glute down ITB   Pain Onset More than a month ago   Pain Frequency Intermittent   Aggravating Factors  LLE weight  bearing                         [facility] Adult PT Treatment/Exercise - 07/09/16 0001      Knee/Hip Exercises: Stretches   Other Knee/Hip Stretches Supine and seated piriformis stretch, cues for technique                PT Education - 07/09/16 1431    Education provided Yes   Education Details Performed and reviewed HEP given 07/03/16. See pt instruction (single leg stance not performed today.)   Person(s) Educated Patient   Methods Explanation;Demonstration;Handout;Verbal cues   Comprehension Verbalized understanding;Returned demonstration          PT Short Term Goals - 06/25/16 1523      PT SHORT TERM  GOAL #1   Title Pt will be IND in HEP to improve balance, strength, and flexibility. TARGET DATE FOR ALL STGS: 07/23/16   Status New     PT SHORT TERM GOAL #2   Title Pt will verbalize understanding of falls risk prevention strategies to reduce falls risk.   Status New     PT SHORT TERM GOAL #3   Title Assess dizziness and cervical spine and write goals prn.    Status New     PT SHORT TERM GOAL #4   Title Pt will improve DGI score to >/=17/24 to reduce falls risk.    Status New     PT SHORT TERM GOAL #5   Title Pt will traverse 4 steps without handrails at MOD I level (decr. speed) to improve functional mobility.    Status New           PT Long Term Goals - 06/25/16 1525      PT LONG TERM GOAL #1   Title Pt will improve DGI score to >/=20/24 to decr. falls risk. TARGET DATE FOR ALL LTGS: 08/20/16    Status New     PT LONG TERM GOAL #2   Title Pt will amb. 700' over even /uneven terrain, while performing head turns, without incr. in dizziness IND to improve functional mobility.   Status New     PT LONG TERM GOAL #3   Title Pt will be able to safely perform squatting activities and stepping over obstacles to perform ADLs IND without LOB.   Status New               Plan - 07/09/16 1443    Clinical Impression Statement Pt seems to have pain in Left glute/ piriformis area and ITB; addressed with piriformis stretch.  Pt reported initial decrease in tightness and pain  but  continued to have LLE pain with gait up to 5/10 with activity.   Rehab Potential Good   Clinical Impairments Affecting Rehab Potential co-morbidities and hx of dizziness.    PT Frequency 2x / week   PT Duration 8 weeks   PT Treatment/Interventions ADLs/Self Care Home Management;Biofeedback;Canalith Repostioning;Electrical Stimulation;Ultrasound;Neuromuscular re-education;Balance training;Passive range of motion;Therapeutic exercise;Therapeutic activities;Manual techniques;Functional mobility training;Stair training;Gait training;DME Instruction;Orthotic Fit/Training;Patient/family education;Vestibular   PT Next Visit Plan Dynamic gait and obstacle negotiation; Assess cx spine AROM and cervical musculature, assess for dizziness. Review balance/strengthening HEP.   Consulted and Agree with Plan of Care Patient      Patient will benefit from skilled therapeutic intervention in order to improve the following deficits and impairments:  Abnormal gait, Decreased endurance, Pain, Dizziness, Decreased range of motion, Impaired flexibility, Postural dysfunction, Decreased balance, Decreased mobility, Decreased knowledge of use of DME, Decreased strength  Visit Diagnosis: Other abnormalities of gait and mobility  Muscle weakness (generalized)  Cervical pain     Problem List Patient Active Problem List   Diagnosis  Date Noted  . Chest tightness or pressure 12/18/2015  . Essential hypertension 12/18/2015  . GERD (gastroesophageal reflux disease) 12/18/2015  . Hyperlipidemia 12/18/2015  . Tobacco abuse counseling 12/18/2015  . Degenerative arthritis of cervical spine 12/18/2015  . Tongue mass 06/28/2014   Hortencia Conradi, PTA  07/09/16, 2:48 PM [facility] [address] [place], [place], [address] Phone: [phone]   Fax:  [phone]  Name: Staria Birkhead MRN: [id number] Date of Birth: November 05, 1945

## 2016-07-09 NOTE — Therapy (Signed)
[facility] [address] [place], [place], [address] Phone: [phone]   Fax:  [phone]  Physical Therapy Treatment  Patient Details  Name: Bethany Fuller MRN: [id number] Date of Birth: Jun 23, 1946 Referring Provider: Dr. Seward Meth  Encounter Date: 07/09/2016      PT End of Session - 07/09/16 1442    Visit Number 3   Number of Visits 17   Date for PT Re-Evaluation 08/24/16   Authorization Type Blue Medicare: G-CODE AND PROGRESS NOTE EVERY 10TH VISIT.   PT Start Time 1405   PT Stop Time 1443   PT Time Calculation (min) 38 min   Equipment Utilized During Treatment --  min guard to S prn   Activity Tolerance Patient tolerated treatment well   Behavior During Therapy WFL for tasks assessed/performed      Past Medical History:  Diagnosis Date  . Arthritis   . Cervical dystonia   . Constipation   . Depression   . Family history of anesthesia complication    Mother- nausea  . GERD (gastroesophageal reflux disease)   . Headache(784.0)    Miagrains- now only once a year. Has light migranies from Dystonia  . Hypertension   . Pneumonia    2011  . Shortness of breath    with extertion   . Wears hearing aid    right    Past Surgical History:  Procedure Laterality Date  . ABDOMINAL HYSTERECTOMY    . CERVICAL FUSION     4, 5, 6  . EXCISION OF TONGUE LESION Right 06/28/2014   Procedure: WIDE LOCAL EXCISION OF THE RIGHT LATERAL TONGUE MASS;  Surgeon: Osborn Coho, MD;  Location: [facility] OR;  Service: ENT;  Laterality: Right;    There were no vitals filed for this visit.      Subjective Assessment - 07/09/16 1406    Subjective Pt did some of the HEP since last visit.   Pertinent History "Goes by Bethany Fuller". HTN, DDD, hyperlipidemia, HOH (R hearing aide), Cx spine fusion and diskectomy in 2010, RLS, spondylolistheis of Cx region   Limitations Other (comment)  traversing stairs, squatting, and stepping  over obstacles.   Patient Stated Goals Get my balance better, and improve neck ROM (if possible) as pt believes neck impairs her balance. Walk straight.    Currently in Pain? Yes   Pain Score 5    Pain Location Leg   Pain Orientation Left   Pain Descriptors / Indicators Aching   Pain Type Chronic pain   Pain Radiating Towards Glute down ITB   Pain Onset More than a month ago   Pain Frequency Intermittent   Aggravating Factors  LLE weight  bearing                         [facility] Adult PT Treatment/Exercise - 07/09/16 0001      Ambulation/Gait   Ambulation/Gait Yes   Ambulation/Gait Assistance 5: Supervision   Ambulation/Gait Assistance Details training working on activity  tolerance   Ambulation Distance (Feet) 200 Feet   Assistive device None   Gait Pattern Step-through pattern;Decreased stride length;Decreased trunk rotation;Decreased weight shift to left;Trendelenburg   Ambulation Surface Level;Indoor     Knee/Hip Exercises: Stretches   Other Knee/Hip Stretches seated piriformis stretch                PT Education - 07/09/16 1431    Education provided Yes   Education Details Performed and reviewed  HEP given 07/03/16.   Person(s) Educated Patient   Methods Explanation;Demonstration;Handout;Verbal cues   Comprehension Verbalized understanding;Returned demonstration          PT Short Term Goals - 06/25/16 1523      PT SHORT TERM GOAL #1   Title Pt will be IND in HEP to improve balance, strength, and flexibility. TARGET DATE FOR ALL STGS: 07/23/16   Status New     PT SHORT TERM GOAL #2   Title Pt will verbalize understanding of falls risk prevention strategies to reduce falls risk.   Status New     PT SHORT TERM GOAL #3   Title Assess dizziness and cervical spine and write goals prn.    Status New     PT SHORT TERM GOAL #4   Title Pt will improve DGI score to >/=17/24 to reduce falls risk.    Status New     PT SHORT TERM GOAL #5   Title  Pt will traverse 4 steps without handrails at MOD I level (decr. speed) to improve functional mobility.    Status New           PT Long Term Goals - 06/25/16 1525      PT LONG TERM GOAL #1   Title Pt will improve DGI score to >/=20/24 to decr. falls risk. TARGET DATE FOR ALL LTGS: 08/20/16   Status New     PT LONG TERM GOAL #2   Title Pt will amb. 700' over even /uneven terrain, while performing head turns, without incr. in dizziness IND to improve functional mobility.   Status New     PT LONG TERM GOAL #3   Title Pt will be able to safely perform squatting activities and stepping over obstacles to perform ADLs IND without LOB.   Status New               Plan - 07/09/16 1443    Clinical Impression Statement Pt seems to have pain in Left glute/ piriformis area and ITB; addressed with piriformis stretch.  Pt continues to have LLE pain with gait up to 5/10 with activity.   Rehab Potential Good   Clinical Impairments Affecting Rehab Potential co-morbidities and hx of dizziness.    PT Frequency 2x / week   PT Duration 8 weeks   PT Treatment/Interventions ADLs/Self Care Home Management;Biofeedback;Canalith Repostioning;Electrical Stimulation;Ultrasound;Neuromuscular re-education;Balance training;Passive range of motion;Therapeutic exercise;Therapeutic activities;Manual techniques;Functional mobility training;Stair training;Gait training;DME Instruction;Orthotic Fit/Training;Patient/family education;Vestibular   PT Next Visit Plan Dynamic gait and obstacle negotiation; Assess cx spine AROM and cervical musculature, assess for dizziness. Review balance/strengthening HEP.   Consulted and Agree with Plan of Care Patient      Patient will benefit from skilled therapeutic intervention in order to improve the following deficits and impairments:  Abnormal gait, Decreased endurance, Pain, Dizziness, Decreased range of motion, Impaired flexibility, Postural dysfunction, Decreased balance,  Decreased mobility, Decreased knowledge of use of DME, Decreased strength  Visit Diagnosis: Other abnormalities of gait and mobility  Muscle weakness (generalized)  Cervical pain     Problem List Patient Active Problem List   Diagnosis Date Noted  . Chest tightness or pressure 12/18/2015  . Essential hypertension 12/18/2015  . GERD (gastroesophageal reflux disease) 12/18/2015  . Hyperlipidemia 12/18/2015  . Tobacco abuse counseling 12/18/2015  . Degenerative arthritis of cervical spine 12/18/2015  . Tongue mass 06/28/2014    Hortencia ConradiKarissa Rayme Bui, PTA  07/09/16, 3:58 PM [facility] [address] [place], [place],  [address] Phone: [phone]   Fax:  [phone]  Name: Bethany Fuller MRN: [id number] Date of Birth: [birth year]

## 2016-07-10 ENCOUNTER — Ambulatory Visit: Payer: Medicare Other | Admitting: Physical Therapy

## 2016-07-10 DIAGNOSIS — M6281 Muscle weakness (generalized): Secondary | ICD-10-CM

## 2016-07-10 DIAGNOSIS — R2689 Other abnormalities of gait and mobility: Secondary | ICD-10-CM

## 2016-07-10 DIAGNOSIS — M542 Cervicalgia: Secondary | ICD-10-CM

## 2016-07-10 NOTE — Therapy (Signed)
[facility] [address] [place], [place], [address] Phone: [phone]   Fax:  [phone]  Physical Therapy Treatment  Patient Details  Name: Bethany Fuller MRN: [id number] Date of Birth: [birth year] Referring Provider: Dr. Seward Meth  Encounter Date: 07/10/2016      PT End of Session - 07/10/16 1400    Visit Number 4   Number of Visits 17   Date for PT Re-Evaluation 08/24/16   Authorization Type Blue Medicare: G-CODE AND PROGRESS NOTE EVERY 10TH VISIT.   PT Start Time 1315   PT Stop Time 1358   PT Time Calculation (min) 43 min   Equipment Utilized During Treatment --  min guard to S prn   Activity Tolerance Patient tolerated treatment well   Behavior During Therapy WFL for tasks assessed/performed      Past Medical History:  Diagnosis Date  . Arthritis   . Cervical dystonia   . Constipation   . Depression   . Family history of anesthesia complication    Mother- nausea  . GERD (gastroesophageal reflux disease)   . Headache(784.0)    Miagrains- now only once a year. Has light migranies from Dystonia  . Hypertension   . Pneumonia    2011  . Shortness of breath    with extertion   . Wears hearing aid    right    Past Surgical History:  Procedure Laterality Date  . ABDOMINAL HYSTERECTOMY    . CERVICAL FUSION     4, 5, 6  . EXCISION OF TONGUE LESION Right 06/28/2014   Procedure: WIDE LOCAL EXCISION OF THE RIGHT LATERAL TONGUE MASS;  Surgeon: Osborn Coho, MD;  Location: [facility] OR;  Service: ENT;  Laterality: Right;    There were no vitals filed for this visit.      Subjective Assessment - 07/10/16 1319    Subjective Pt feels the piriformis stretch helps a little with the tightness.  Gets botox shot in neck next month.   Pertinent History "Goes by Bethany Fuller". HTN, DDD, hyperlipidemia, HOH (R hearing aide), Cx spine fusion and diskectomy in 2010, RLS, spondylolistheis of Cx region   Limitations  Other (comment)  traversing stairs, squatting, and stepping over obstacles.   Patient Stated Goals Get my balance better, and improve neck ROM (if possible) as pt believes neck impairs her balance. Walk straight.    Pain Onset More than a month ago                         [facility] Adult PT Treatment/Exercise - 07/10/16 0001      Ambulation/Gait   Ambulation/Gait Yes   Ambulation/Gait Assistance 5: Supervision   Ambulation/Gait Assistance Details Working on activity tolerance, and balance with longer gait distances   Ambulation Distance (Feet) 800 Feet   Assistive device None   Gait Pattern Step-through pattern;Decreased stride length;Decreased trunk rotation;Decreased weight shift to left;Trendelenburg   Ambulation Surface Level;Indoor     Knee/Hip Exercises: Stretches   Other Knee/Hip Stretches seated L piriformis stretch     Knee/Hip Exercises: Standing   Other Standing Knee Exercises "hip hiking" to activitating glut medius with one foot on 4" block  10x each     Knee/Hip Exercises: Sidelying   Clams 10x2             Balance Exercises - 07/10/16 1357      Balance Exercises: Standing   Tandem Stance Eyes open;Intermittent upper extremity support; 2 rep;20  secs           PT Education - 07/10/16 1401    Education provided Yes   Education Details Safety with walking longer distances   Person(s) Educated Patient   Methods Explanation   Comprehension Verbalized understanding;Returned demonstration          PT Short Term Goals - 06/25/16 1523      PT SHORT TERM GOAL #1   Title Pt will be IND in HEP to improve balance, strength, and flexibility. TARGET DATE FOR ALL STGS: 07/23/16   Status New     PT SHORT TERM GOAL #2   Title Pt will verbalize understanding of falls risk prevention strategies to reduce falls risk.   Status New     PT SHORT TERM GOAL #3   Title Assess dizziness and cervical spine and write goals prn.    Status New     PT  SHORT TERM GOAL #4   Title Pt will improve DGI score to >/=17/24 to reduce falls risk.    Status New     PT SHORT TERM GOAL #5   Title Pt will traverse 4 steps without handrails at MOD I level (decr. speed) to improve functional mobility.    Status New           PT Long Term Goals - 06/25/16 1525      PT LONG TERM GOAL #1   Title Pt will improve DGI score to >/=20/24 to decr. falls risk. TARGET DATE FOR ALL LTGS: 08/20/16   Status New     PT LONG TERM GOAL #2   Title Pt will amb. 700' over even /uneven terrain, while performing head turns, without incr. in dizziness IND to improve functional mobility.   Status New     PT LONG TERM GOAL #3   Title Pt will be able to safely perform squatting activities and stepping over obstacles to perform ADLs IND without LOB.   Status New               Plan - 07/10/16 1327    Clinical Impression Statement Continued with hip strengthening and stretching to address LE esp. LLE weakness, pain, and instability during standing balance and longer walking distances.  Pt is progressing with stability with gait but at min 3.5 noted some instability Left foot caught ground x1 and had scissoring x1; pt self corrected. No c/o pain this session but fatigue in LEs.                                           Rehab Potential Good   Clinical Impairments Affecting Rehab Potential co-morbidities and hx of dizziness.    PT Frequency 2x / week   PT Duration 8 weeks   PT Treatment/Interventions ADLs/Self Care Home Management;Biofeedback;Canalith Repostioning;Electrical Stimulation;Ultrasound;Neuromuscular re-education;Balance training;Passive range of motion;Therapeutic exercise;Therapeutic activities;Manual techniques;Functional mobility training;Stair training;Gait training;DME Instruction;Orthotic Fit/Training;Patient/family education;Vestibular   PT Next Visit Plan Dynamic gait and obstacle negotiation; Assess cx spine AROM and cervical musculature, assess  for dizziness. Review balance/strengthening HEP.      Patient will benefit from skilled therapeutic intervention in order to improve the following deficits and impairments:  Abnormal gait, Decreased endurance, Pain, Dizziness, Decreased range of motion, Impaired flexibility, Postural dysfunction, Decreased balance, Decreased mobility, Decreased knowledge of use of DME, Decreased strength  Visit Diagnosis: Other abnormalities of gait and mobility  Muscle  weakness (generalized)  Cervical pain     Problem List Patient Active Problem List   Diagnosis Date Noted  . Chest tightness or pressure 12/18/2015  . Essential hypertension 12/18/2015  . GERD (gastroesophageal reflux disease) 12/18/2015  . Hyperlipidemia 12/18/2015  . Tobacco abuse counseling 12/18/2015  . Degenerative arthritis of cervical spine 12/18/2015  . Tongue mass 06/28/2014    Hortencia Conradi, PTA  07/10/16, 4:30 PM [facility] [address] [place], [place], [address] Phone: [phone]   Fax:  [phone]  Name: Bethany Fuller MRN: [id number] Date of Birth: [birth year]

## 2016-07-10 NOTE — Patient Instructions (Signed)
Walking Program:  Begin walking for exercise for 3 minutes, 3 times/day, most days/week.   Progress your walking program by adding1-2 minutes to your routine each week, as tolerated. Be sure to wear good walking shoes, walk in a safe environment and only progress to your tolerance.

## 2016-07-16 ENCOUNTER — Encounter: Payer: Self-pay | Admitting: Physical Therapy

## 2016-07-16 ENCOUNTER — Ambulatory Visit: Payer: Medicare Other | Admitting: Physical Therapy

## 2016-07-16 DIAGNOSIS — M6281 Muscle weakness (generalized): Secondary | ICD-10-CM

## 2016-07-16 DIAGNOSIS — R2689 Other abnormalities of gait and mobility: Secondary | ICD-10-CM

## 2016-07-16 NOTE — Therapy (Signed)
[facility] [address] [place], [place], [address] Phone: [phone]   Fax:  [phone]  Physical Therapy Treatment  Patient Details  Name: Bethany Fuller MRN: [id number] Date of Birth: Apr 01, 1946 Referring Provider: Dr. Seward Meth  Encounter Date: 07/16/2016      PT End of Session - 07/16/16 1352    Visit Number 5   Number of Visits 17   Date for PT Re-Evaluation 08/24/16   Authorization Type Blue Medicare: G-CODE AND PROGRESS NOTE EVERY 10TH VISIT.   Equipment Utilized During Treatment --  min guard to S prn   Activity Tolerance Patient tolerated treatment well   Behavior During Therapy [facility] for tasks assessed/performed      Past Medical History:  Diagnosis Date  . Arthritis   . Cervical dystonia   . Constipation   . Depression   . Family history of anesthesia complication    Mother- nausea  . GERD (gastroesophageal reflux disease)   . Headache(784.0)    Miagrains- now only once a year. Has light migranies from Dystonia  . Hypertension   . Pneumonia    2011  . Shortness of breath    with extertion   . Wears hearing aid    right    Past Surgical History:  Procedure Laterality Date  . ABDOMINAL HYSTERECTOMY    . CERVICAL FUSION     4, 5, 6  . EXCISION OF TONGUE LESION Right 06/28/2014   Procedure: WIDE LOCAL EXCISION OF THE RIGHT LATERAL TONGUE MASS;  Surgeon: Osborn Coho, MD;  Location: [facility] OR;  Service: ENT;  Laterality: Right;    There were no vitals filed for this visit.      Subjective Assessment - 07/16/16 1321    Subjective Pt reports that she is walking up to 4 min for HEP.  LLE is getting better with less pain duirng functional activities.   Pertinent History "Goes by Bethany Fuller". HTN, DDD, hyperlipidemia, HOH (R hearing aide), Cx spine fusion and diskectomy in 2010, RLS, spondylolistheis of Cx region   Limitations Other (comment)  traversing stairs, squatting, and stepping  over obstacles.   Patient Stated Goals Get my balance better, and improve neck ROM (if possible) as pt believes neck impairs her balance. Walk straight.    Currently in Pain? Yes   Pain Score 4    Pain Location Leg   Pain Orientation Left   Pain Descriptors / Indicators Discomfort   Pain Onset More than a month ago   Pain Frequency Intermittent                         [facility] Adult PT Treatment/Exercise - 07/16/16 0001          --    --     Knee/Hip Exercises: Aerobic   Nustep Level 3, 10 min, 50 steps/min             Balance Exercises - 07/16/16 1326      Balance Exercises: Standing   Tandem Stance   Progressing with tandem gait Intermittent upper extremity support;Eyes open;2 reps;30 secs x4   SLS  Step ups with SLS 30 secs;Intermittent upper extremity support;Eyes open;2 reps x10 each   Step Over Hurdles / Cones side stepping, min guard to min A   Marching Limitations intermittent UE support marching forward with 2 sec holds      Pt education: benefits of aerobic exercise for cardiovascular health. Option: Silver Sneakers, water  aerobics. Pt verbalized understanding.       PT Short Term Goals - 06/25/16 1523      PT SHORT TERM GOAL #1   Title Pt will be IND in HEP to improve balance, strength, and flexibility. TARGET DATE FOR ALL STGS: 07/23/16   Status New     PT SHORT TERM GOAL #2   Title Pt will verbalize understanding of falls risk prevention strategies to reduce falls risk.   Status New     PT SHORT TERM GOAL #3   Title Assess dizziness and cervical spine and write goals prn.    Status New     PT SHORT TERM GOAL #4   Title Pt will improve DGI score to >/=17/24 to reduce falls risk.    Status New     PT SHORT TERM GOAL #5   Title Pt will traverse 4 steps without handrails at MOD I level (decr. speed) to improve functional mobility.    Status New           PT Long Term Goals - 06/25/16 1525      PT LONG TERM GOAL #1    Title Pt will improve DGI score to >/=20/24 to decr. falls risk. TARGET DATE FOR ALL LTGS: 08/20/16   Status New     PT LONG TERM GOAL #2   Title Pt will amb. 700' over even /uneven terrain, while performing head turns, without incr. in dizziness IND to improve functional mobility.   Status New     PT LONG TERM GOAL #3   Title Pt will be able to safely perform squatting activities and stepping over obstacles to perform ADLs IND without LOB.   Status New               Plan - 07/16/16 1352    Clinical Impression Statement Progressing with increase activity tolerance including Nu- step, and SLS stance activities; requiring less rest breaks and reports little to no pain during session.   Rehab Potential Good   Clinical Impairments Affecting Rehab Potential co-morbidities and hx of dizziness.    PT Frequency 2x / week   PT Duration 8 weeks   PT Treatment/Interventions ADLs/Self Care Home Management;Biofeedback;Canalith Repostioning;Electrical Stimulation;Ultrasound;Neuromuscular re-education;Balance training;Passive range of motion;Therapeutic exercise;Therapeutic activities;Manual techniques;Functional mobility training;Stair training;Gait training;DME Instruction;Orthotic Fit/Training;Patient/family education;Vestibular   PT Next Visit Plan Dynamic gait and obstacle negotiation; Assess cx spine AROM and cervical musculature, assess for dizziness. Review balance/strengthening HEP.      Patient will benefit from skilled therapeutic intervention in order to improve the following deficits and impairments:  Abnormal gait, Decreased endurance, Pain, Dizziness, Decreased range of motion, Impaired flexibility, Postural dysfunction, Decreased balance, Decreased mobility, Decreased knowledge of use of DME, Decreased strength  Visit Diagnosis: Other abnormalities of gait and mobility  Muscle weakness (generalized)     Problem List Patient Active Problem List   Diagnosis Date Noted  .  Chest tightness or pressure 12/18/2015  . Essential hypertension 12/18/2015  . GERD (gastroesophageal reflux disease) 12/18/2015  . Hyperlipidemia 12/18/2015  . Tobacco abuse counseling 12/18/2015  . Degenerative arthritis of cervical spine 12/18/2015  . Tongue mass 06/28/2014    Hortencia ConradiKarissa Fredrick [place], PTA  07/16/16, 2:21 PM [facility] [address] [place], [place], [address] Phone: [phone]   Fax:  [phone]  Name: Bethany Fuller MRN: [id number] Date of Birth: 14-Feb-1946

## 2016-07-17 ENCOUNTER — Encounter: Payer: Self-pay | Admitting: Physical Therapy

## 2016-07-17 ENCOUNTER — Ambulatory Visit: Payer: Medicare Other | Admitting: Physical Therapy

## 2016-07-17 DIAGNOSIS — M6281 Muscle weakness (generalized): Secondary | ICD-10-CM

## 2016-07-17 DIAGNOSIS — R2689 Other abnormalities of gait and mobility: Secondary | ICD-10-CM

## 2016-07-17 NOTE — Therapy (Signed)
[facility] [address] [place], [place], [address] Phone: [phone]   Fax:  [phone]  Physical Therapy Treatment  Patient Details  Name: Bethany Fuller MRN: [id number] Date of Birth: 1946/06/12 Referring Provider: Dr. Seward Meth  Encounter Date: 07/17/2016      PT End of Session - 07/17/16 1359    Visit Number 6   Number of Visits 17   Date for PT Re-Evaluation 08/24/16   Authorization Type Blue Medicare: G-CODE AND PROGRESS NOTE EVERY 10TH VISIT.   PT Start Time 1316   PT Stop Time 1400   PT Time Calculation (min) 44 min   Equipment Utilized During Treatment --  min guard to S prn   Activity Tolerance Patient tolerated treatment well   Behavior During Therapy WFL for tasks assessed/performed      Past Medical History:  Diagnosis Date  . Arthritis   . Cervical dystonia   . Constipation   . Depression   . Family history of anesthesia complication    Mother- nausea  . GERD (gastroesophageal reflux disease)   . Headache(784.0)    Miagrains- now only once a year. Has light migranies from Dystonia  . Hypertension   . Pneumonia    2011  . Shortness of breath    with extertion   . Wears hearing aid    right    Past Surgical History:  Procedure Laterality Date  . ABDOMINAL HYSTERECTOMY    . CERVICAL FUSION     4, 5, 6  . EXCISION OF TONGUE LESION Right 06/28/2014   Procedure: WIDE LOCAL EXCISION OF THE RIGHT LATERAL TONGUE MASS;  Surgeon: Osborn Coho, MD;  Location: [facility] OR;  Service: ENT;  Laterality: Right;    There were no vitals filed for this visit.      Subjective Assessment - 07/17/16 1319    Subjective "Didn't bother me a bit," Pt regarding LLE after treatment yesterday.  Pt only has vertigo at home when intially lying down or getting up. Pt gets shots for neck at the beginning of November.   Pertinent History "Goes by Bethany Fuller". HTN, DDD, hyperlipidemia, HOH (R hearing aide),  Cx spine fusion and diskectomy in 2010, RLS, spondylolistheis of Cx region   Limitations Other (comment)  traversing stairs, squatting, and stepping over obstacles.   Patient Stated Goals Get my balance better, and improve neck ROM (if possible) as pt believes neck impairs her balance. Walk straight.    Currently in Pain? No/denies   Pain Onset More than a month ago                         [facility] Adult PT Treatment/Exercise - 07/17/16 0001      Ambulation/Gait   Ambulation/Gait Yes   Ambulation/Gait Assistance 4: Min guard   Ambulation/Gait Assistance Details Balance on compliant surface progressing with head turns   Ambulation Distance (Feet) 100 Feet   Assistive device None   Gait Pattern Step-through pattern;Decreased stride length;Decreased trunk rotation;Decreased weight shift to left;Trendelenburg   Ambulation Surface Level;Indoor             Balance Exercises - 07/17/16 1329      Balance Exercises: Standing   Standing Eyes Opened Wide (BOA);Foam/compliant surface;Head turns, UE movments +hip sway lateral, A/P   Standing Eyes Closed Narrow base of support (BOS);3 reps;10 secs  intermittent UE support           PT Education - 07/17/16  1346    Education provided Yes   Education Details Added standing balance on compliant surface training to HEP, and POC   Person(s) Educated Patient   Methods Explanation;Demonstration;Verbal cues;Handout   Comprehension Verbalized understanding;Returned demonstration;Need further instruction          PT Short Term Goals - 06/25/16 1523      PT SHORT TERM GOAL #1   Title Pt will be IND in HEP to improve balance, strength, and flexibility. TARGET DATE FOR ALL STGS: 07/23/16   Status New     PT SHORT TERM GOAL #2   Title Pt will verbalize understanding of falls risk prevention strategies to reduce falls risk.   Status New     PT SHORT TERM GOAL #3   Title Assess dizziness and cervical spine and write goals  prn.    Status New     PT SHORT TERM GOAL #4   Title Pt will improve DGI score to >/=17/24 to reduce falls risk.    Status New     PT SHORT TERM GOAL #5   Title Pt will traverse 4 steps without handrails at MOD I level (decr. speed) to improve functional mobility.    Status New           PT Long Term Goals - 06/25/16 1525      PT LONG TERM GOAL #1   Title Pt will improve DGI score to >/=20/24 to decr. falls risk. TARGET DATE FOR ALL LTGS: 08/20/16   Status New     PT LONG TERM GOAL #2   Title Pt will amb. 700' over even /uneven terrain, while performing head turns, without incr. in dizziness IND to improve functional mobility.   Status New     PT LONG TERM GOAL #3   Title Pt will be able to safely perform squatting activities and stepping over obstacles to perform ADLs IND without LOB.   Status New               Plan - 07/17/16 1333    Clinical Impression Statement Added standing on compliant surface exercises to HEP (no activities with head turns were added due to neck discomfort when trialling); pt required intermittent UE support.  Pt requires min guard with gait on compliant surface with visual scanning technique.   Rehab Potential Good   Clinical Impairments Affecting Rehab Potential co-morbidities and hx of dizziness.    PT Frequency 2x / week   PT Duration 8 weeks   PT Treatment/Interventions ADLs/Self Care Home Management;Biofeedback;Canalith Repostioning;Electrical Stimulation;Ultrasound;Neuromuscular re-education;Balance training;Passive range of motion;Therapeutic exercise;Therapeutic activities;Manual techniques;Functional mobility training;Stair training;Gait training;DME Instruction;Orthotic Fit/Training;Patient/family education;Vestibular   PT Next Visit Plan Check STGs, compliant surface training; Dynamic gait and obstacle negotiation; Assess cx spine AROM and cervical musculature, assess for dizziness. Review balance/strengthening HEP.      Patient  will benefit from skilled therapeutic intervention in order to improve the following deficits and impairments:  Abnormal gait, Decreased endurance, Pain, Dizziness, Decreased range of motion, Impaired flexibility, Postural dysfunction, Decreased balance, Decreased mobility, Decreased knowledge of use of DME, Decreased strength  Visit Diagnosis: Other abnormalities of gait and mobility  Muscle weakness (generalized)     Problem List Patient Active Problem List   Diagnosis Date Noted  . Chest tightness or pressure 12/18/2015  . Essential hypertension 12/18/2015  . GERD (gastroesophageal reflux disease) 12/18/2015  . Hyperlipidemia 12/18/2015  . Tobacco abuse counseling 12/18/2015  . Degenerative arthritis of cervical spine 12/18/2015  . Tongue mass 06/28/2014  Hortencia Conradi, PTA  07/17/16, 2:03 PM [facility] [address] [place], [place], [address] Phone: [phone]   Fax:  [phone]  Name: Bethany Fuller MRN: [id number] Date of Birth: April 08, 1946

## 2016-07-17 NOTE — Patient Instructions (Addendum)
Feet Together (Compliant Surface) Arm Motion - Eyes Closed    Stand on compliant surface: __pillow______ with feet together. Close eyes 1. Hold 10sec x3, 2. move arms up and down x10 3. Moves arms together in diagonal motion 5x each side . Do _1___ sessions per day.  Copyright  VHI. All rights reserved.

## 2016-07-21 ENCOUNTER — Ambulatory Visit: Payer: Medicare Other | Admitting: Physical Therapy

## 2016-07-21 DIAGNOSIS — R2689 Other abnormalities of gait and mobility: Secondary | ICD-10-CM | POA: Diagnosis not present

## 2016-07-21 DIAGNOSIS — M6281 Muscle weakness (generalized): Secondary | ICD-10-CM

## 2016-07-21 NOTE — Patient Instructions (Addendum)
Feet Together (Compliant Surface) Arm Motion - Eyes Closed    SBridging    Slowly raise buttocks from floor, keeping stomach tight. Repeat _10___ times per set. Do _2___ sets per session. Do __1-2__ sessions per day.  http://orth.exer.us/1097

## 2016-07-21 NOTE — Therapy (Addendum)
[facility] [address] [place], [place], [address] Phone: [phone]   Fax:  [phone]  Physical Therapy Treatment  Patient Details  Name: Bethany Fuller MRN: [id number] Date of Birth: 01/20/1946 Referring Provider: Dr. Browner  Encounter Date: 07/21/2016      PT End of Session - 07/21/16 1401    Visit Number 7   Number of Visits 17   Date for PT Re-Evaluation 08/24/16   Authorization Type Blue Medicare: G-CODE AND PROGRESS NOTE EVERY 10TH VISIT.   PT Start Time 1322   PT Stop Time 1400   PT Time Calculation (min) 38 min   Equipment Utilized During Treatment --  min guard to S prn   Activity Tolerance Patient tolerated treatment well   Behavior During Therapy WFL for tasks assessed/performed      Past Medical History:  Diagnosis Date  . Arthritis   . Cervical dystonia   . Constipation   . Depression   . Family history of anesthesia complication    Mother- nausea  . GERD (gastroesophageal reflux disease)   . Headache(784.0)    Miagrains- now only once a year. Has light migranies from Dystonia  . Hypertension   . Pneumonia    2011  . Shortness of breath    with extertion   . Wears hearing aid    right    Past Surgical History:  Procedure Laterality Date  . ABDOMINAL HYSTERECTOMY    . CERVICAL FUSION     4, 5, 6  . EXCISION OF TONGUE LESION Right 06/28/2014   Procedure: WIDE LOCAL EXCISION OF THE RIGHT LATERAL TONGUE MASS;  Surgeon: David Shoemaker, MD;  Location: [facility] OR;  Service: ENT;  Laterality: Right;    There were no vitals filed for this visit.      Subjective Assessment - 07/21/16 1324    Subjective Did a lot of lifting changing out clothes for the season; ended up having a terrible headache and did not walk for exercise.  Pt plans to d/c today due to financial reasons and it being a busy time for her. Plans to do HEP when less busy.   Pertinent History "Goes by [name]".  HTN, DDD, hyperlipidemia, HOH (R hearing aide), Cx spine fusion and diskectomy in 2010, RLS, spondylolistheis of Cx region   Limitations Other (comment)  traversing stairs, squatting, and stepping over obstacles.   Patient Stated Goals Get my balance better, and improve neck ROM (if possible) as pt believes neck impairs her balance. Walk straight.    Currently in Pain? Yes   Pain Score 7    Pain Location Head   Pain Orientation Posterior   Pain Descriptors / Indicators Headache;Tightness   Pain Type Chronic pain   Pain Radiating Towards head to shoulders   Pain Onset More than a month ago   Pain Frequency Constant   Aggravating Factors  lifting with arms over the weekend   Pain Relieving Factors shots, rest, pain meds            [facility] PT Assessment - 07/21/16 0001      Dynamic Gait Index   Level Surface Normal   Change in Gait Speed Mild Impairment   Gait with Horizontal Head Turns Mild Impairment   Gait with Vertical Head Turns Mild Impairment   Gait and Pivot Turn Normal   Step Over Obstacle Normal   Step Around Obstacles Normal   Steps Mild Impairment   Total Score 20                       [facility] Adult PT Treatment/Exercise - 07/21/16 0001      Lumbar Exercises: Supine   Bridge 10 reps             Balance Exercises - 07/21/16 1358      Balance Exercises: Standing   Standing Eyes Closed Narrow base of support (BOS);3 reps;10 secs  UE motions: up/dpwn and diagonal           PT Education - 07/21/16 1353    Education provided Yes   Education Details Discussed goals checked, and pt's desire to d/c at this time.  Importance on HEP/ continuing fitness plan.   Person(s) Educated Patient   Methods Explanation   Comprehension Verbalized understanding          PT Short Term Goals - 07/21/16 1337      PT SHORT TERM GOAL #1   Title Pt will be IND in HEP to improve balance, strength, and flexibility. TARGET DATE FOR ALL STGS: 07/23/16   Baseline  Has not performed standing balance on compliant surface, but has performed walking and LEs strengthening programs, although inconsistently.   Status Partially Met     PT SHORT TERM GOAL #2   Title Pt will verbalize understanding of falls risk prevention strategies to reduce falls risk.   Status New     PT SHORT TERM GOAL #3   Title Assess dizziness and cervical spine and write goals prn.    Baseline Due to conversation with primary PT:  Pt is planning to have botox injections in a couple of weeks and ROM at this time aggravates symptoms.   Status Deferred     PT SHORT TERM GOAL #4   Title Pt will improve DGI score to >/=17/24 to reduce falls risk.    Baseline Met 20/24   Status Achieved     PT SHORT TERM GOAL #5   Title Pt will traverse 4 steps without handrails at MOD I level (decr. speed) to improve functional mobility.    Baseline Pt requires 1 rail for safety.   Status Not Met           PT Long Term Goals - 06/25/16 1525      PT LONG TERM GOAL #1   Title Pt will improve DGI score to >/=20/24 to decr. falls risk. TARGET DATE FOR ALL LTGS: 08/20/16   Status New     PT LONG TERM GOAL #2   Title Pt will amb. 700' over even /uneven terrain, while performing head turns, without incr. in dizziness IND to improve functional mobility.   Status New     PT LONG TERM GOAL #3   Title Pt will be able to safely perform squatting activities and stepping over obstacles to perform ADLs IND without LOB.   Status New               Plan - 07/21/16 1411    Clinical Impression Statement Pt decided today would be her last visit due to busyness and financial reasons.  Pt feels like her balance with mobility has gotten better but that her neck hurts more as she approaches her date to get botox shots ( which is typical per pt).  Pt feels like she has a good continuing fitness program but has performed inconsistently.                                                             Rehab  Potential Good   Clinical Impairments Affecting Rehab Potential co-morbidities and hx of dizziness.    PT Frequency 2x / week   PT Duration 8 weeks   PT Treatment/Interventions ADLs/Self Care Home Management;Biofeedback;Canalith Repostioning;Electrical Stimulation;Ultrasound;Neuromuscular re-education;Balance training;Passive range of motion;Therapeutic exercise;Therapeutic activities;Manual techniques;Functional mobility training;Stair training;Gait training;DME Instruction;Orthotic Fit/Training;Patient/family education;Vestibular   PT Next Visit Plan Send D/C to MD      Patient will benefit from skilled therapeutic intervention in order to improve the following deficits and impairments:  Abnormal gait, Decreased endurance, Pain, Dizziness, Decreased range of motion, Impaired flexibility, Postural dysfunction, Decreased balance, Decreased mobility, Decreased knowledge of use of DME, Decreased strength  Visit Diagnosis: Other abnormalities of gait and mobility  Muscle weakness (generalized)   G-code: mobility, DGI: 20/24 Goal: CI D/c: CI  G-code completed by PT. Jennifer Miller, PT,DPT 07/24/16 12:28 PM Phone: [phone] Fax: [phone]   Problem List Patient Active Problem List   Diagnosis Date Noted  . Chest tightness or pressure 12/18/2015  . Essential hypertension 12/18/2015  . GERD (gastroesophageal reflux disease) 12/18/2015  . Hyperlipidemia 12/18/2015  . Tobacco abuse counseling 12/18/2015  . Degenerative arthritis of cervical spine 12/18/2015  . Tongue mass 06/28/2014    Karissa Hicks, PTA  07/21/16, 3:50 PM [facility] [address] [place], [place], [address] Phone: [phone]   Fax:  [phone]  Name: Bethany Fuller MRN: [id number] Date of Birth: 03/03/1946  PHYSICAL THERAPY DISCHARGE SUMMARY  Visits from Start of Care: 7  Current functional level related to goals / functional  outcomes:      PT Short Term Goals - 07/21/16 1337      PT SHORT TERM GOAL #1   Title Pt will be IND in HEP to improve balance, strength, and flexibility. TARGET DATE FOR ALL STGS: 07/23/16   Baseline Has not performed standing balance on compliant surface, but has performed walking and LEs strengthening programs, although inconsistently.   Status Partially Met     PT SHORT TERM GOAL #2   Title Pt will verbalize understanding of falls risk prevention strategies to reduce falls risk.   Status New     PT SHORT TERM GOAL #3   Title Assess dizziness and cervical spine and write goals prn.    Baseline Due to conversation with primary PT.   Status Deferred     PT SHORT TERM GOAL #4   Title Pt will improve DGI score to >/=17/24 to reduce falls risk.    Baseline Met 20/24   Status Achieved     PT SHORT TERM GOAL #5   Title Pt will traverse 4 steps without handrails at MOD I level (decr. speed) to improve functional mobility.    Baseline Pt requires 1 rail for safety.   Status Not Met          PT Long Term Goals - 06/25/16 1525      PT LONG TERM GOAL #1   Title Pt will improve DGI score to >/=20/24 to decr. falls risk. TARGET DATE FOR ALL LTGS: 08/20/16   Status New     PT LONG TERM GOAL #2   Title Pt will amb. 700' over even /uneven terrain, while performing head turns, without incr. in dizziness IND to improve functional mobility.   Status New     PT LONG TERM GOAL #3   Title Pt will be able to safely perform squatting activities and stepping over obstacles to perform ADLs IND without LOB.     Status New       Remaining deficits: impaired balance. Pt wishes to d/c due to financial and schedule concerns.   Education / Equipment: HEP  Plan: Patient agrees to discharge.  Patient goals were partially met. Patient is being discharged due to financial reasons.  ?????        Geoffry Paradise, PT,DPT 07/24/16 12:31 PM Phone: [phone] Fax: [phone]

## 2016-07-24 ENCOUNTER — Ambulatory Visit: Payer: Medicare Other | Admitting: Physical Therapy

## 2016-07-25 ENCOUNTER — Encounter: Payer: Self-pay | Admitting: Cardiovascular Disease

## 2016-07-25 ENCOUNTER — Ambulatory Visit (INDEPENDENT_AMBULATORY_CARE_PROVIDER_SITE_OTHER): Payer: Medicare Other | Admitting: Cardiovascular Disease

## 2016-07-25 VITALS — BP 110/70 | HR 65 | Ht 63.0 in | Wt 197.0 lb

## 2016-07-25 DIAGNOSIS — I10 Essential (primary) hypertension: Secondary | ICD-10-CM

## 2016-07-25 DIAGNOSIS — E782 Mixed hyperlipidemia: Secondary | ICD-10-CM

## 2016-07-25 DIAGNOSIS — I519 Heart disease, unspecified: Secondary | ICD-10-CM

## 2016-07-25 DIAGNOSIS — E668 Other obesity: Secondary | ICD-10-CM | POA: Diagnosis not present

## 2016-07-25 DIAGNOSIS — I5189 Other ill-defined heart diseases: Secondary | ICD-10-CM

## 2016-07-25 MED ORDER — ATORVASTATIN CALCIUM 40 MG PO TABS: 40.0000 mg | ORAL_TABLET | Freq: Every day | ORAL | 3 refills | Status: DC

## 2016-07-25 MED ORDER — AMLODIPINE BESYLATE 5 MG PO TABS: ORAL_TABLET | ORAL | 3 refills | Status: DC

## 2016-07-25 NOTE — Progress Notes (Signed)
Patient ID: Bethany Fuller, female   DOB: 29-Sep-1946, 70 y.o.   MRN: [id number]     Primary MD:  Dr. Nelda Bucks  PATIENT PROFILE: Bethany Fuller is a 70 y.o. female who was referred through the courtesy of Dr. Ilda Basset for evaluation of significant recent blood pressure lability as well as chest tightness.  She presents for follow-up evaluation.  HPI:  Bethany Fuller has a long-standing history of hypertension for at least 10 years.  Remotely, she had been on felodipine for blood pressure control  And she states that she ultimately was taken off this therapy and had been off this for the past year. She recently had been evaluated and her blood pressure was 162/107 and then when she saw physician at [facility].  Her blood pressure was 152/101. She was recently evaluated I, Dr. Delena Bali on 12/11/2015 and felodipine was reinstituted. Patient had also complained of some mild headaches and dizziness.  Bethany Fuller has a history of tobacco use , family history for CAD, as well as hyperlipidemia. She describes her recent chest pain as a tightness associated with shortness of breath with mild radiation to her neck and shoulders her symptoms can occur at any time and are not classically exertionally precipitated. She has had significant difficulty with her mouth and had recently seen a neurologist for facial pain.  When I initially saw her, her ECG was unremarkable.  She underwent laboratory which revealed a normal CBC and chemistry profile.  Her lipid studies revealed mild triglyceride elevation at 184 with a total cholesterol 147, LDL cholesterol 64 and HDL cholesterol 46.  I recommended that we discontinue simvastatin 80 mg but for equal potency changed her to atorvastatin 40 mg, since the element 80 mg dose is no longer recommended and with simvastatin.  She may have dose limitations based on concomitant therapy.  I initiated metoprolol 25 mg twice a day and recommended that she undergo an echo  Doppler study and Myoview scan.  The echo Doppler study from 01/07/2016 showed an EF of 60-65%, and grade 2 diastolic dysfunction with normal wall motion.  There was aortic valve sclerosis.  Her nuclear study did not reveal any ST segment changes.  An atrial septal and inferolateral defect was noted, but this was felt most likely due to attenuation artifact.  There was no ischemia.  She had normal wall motion with an ejection fraction of 58%.  The study was interpreted as low risk.  When I last saw her, she had run out of felodipine and I started her on amlodipine.  Initially, a 2.5 mg and titrated this to 5 mg.  She has tolerated this well.  She states her blood pressure has been controlled.  She denies significant leg swelling.  Bethany Fuller presents for follow-up evaluation.  Additional medical problems include history of iron deficiency anemia, seasonal rhinitis, degenerative disease of her cervical vertebral region, osteoarthritis, depression, restless leg syndrome, and urinary incontinence.  Past Medical History:  Diagnosis Date  . Arthritis   . Cervical dystonia   . Constipation   . Depression   . Family history of anesthesia complication    Mother- nausea  . GERD (gastroesophageal reflux disease)   . Headache(784.0)    Miagrains- now only once a year. Has light migranies from Dystonia  . Hypertension   . Pneumonia    2011  . Shortness of breath    with extertion   . Wears hearing aid    right    Past  Surgical History:  Procedure Laterality Date  . ABDOMINAL HYSTERECTOMY    . CERVICAL FUSION     4, 5, 6  . EXCISION OF TONGUE LESION Right 06/28/2014   Procedure: WIDE LOCAL EXCISION OF THE RIGHT LATERAL TONGUE MASS;  Surgeon: Jerrell Belfast, MD;  Location: [place];  Service: ENT;  Laterality: Right;    Allergies  Allergen Reactions  . Morphine And Related Itching and Rash  . Sulfa Antibiotics Itching and Rash    Current Outpatient Prescriptions  Medication Sig Dispense Refill  .  amLODipine (NORVASC) 5 MG tablet Take 1/2-1 tablet daily per Dr Evette Georges recommendatoins. 90 tablet 3  . atorvastatin (LIPITOR) 40 MG tablet Take 1 tablet (40 mg total) by mouth daily. 90 tablet 3  . baclofen (LIORESAL) 10 MG tablet Take 20 mg by mouth 3 (three) times daily.    . calcium carbonate (OS-CAL) 600 MG TABS tablet Take 1,200 mg by mouth daily.    . Calcium-Magnesium-Vitamin D (CALCIUM 1200+D3 PO) Take 1 tablet by mouth daily.    . CHANTIX 1 MG tablet Take 1 mg by mouth 2 (two) times daily.  5  . Cholecalciferol (VITAMIN D-3) 5000 UNITS TABS Take 5,000 Units by mouth daily.    . clonazePAM (KLONOPIN) 0.5 MG tablet Take 1.5 mg by mouth 3 (three) times daily.    Marland Kitchen gabapentin (NEURONTIN) 300 MG capsule Take 300-900 mg by mouth 3 (three) times daily. 600 mg in the morning, 300 mg at lunch and 1200 mg at night    . meclizine (ANTIVERT) 25 MG tablet Take 25 mg by mouth daily. Scheduled 1 time a day.    . meloxicam (MOBIC) 15 MG tablet Take 15 mg by mouth daily.    . Multiple Vitamin (MULTIVITAMIN WITH MINERALS) TABS tablet Take 1 tablet by mouth daily.    . Multiple Vitamins-Minerals (HAIR/SKIN/NAILS PO) Take 3 tablets by mouth daily.    . Omega-3 Fatty Acids (FISH OIL) 1200 MG CAPS Take 1,200 mg by mouth daily.    Marland Kitchen omeprazole (PRILOSEC) 20 MG capsule Take 20 mg by mouth daily.    . OnabotulinumtoxinA (BOTOX IJ) Inject as directed every 3 (three) months.    . pilocarpine (SALAGEN) 5 MG tablet Take 5 mg by mouth 3 (three) times daily.  12  . polyethylene glycol (MIRALAX / GLYCOLAX) packet Take 17 g by mouth as needed.    . Pyridoxine HCl (VITAMIN B-6 PO) Take 1 tablet by mouth daily.    Marland Kitchen rOPINIRole (REQUIP) 0.25 MG tablet Take 1 mg by mouth at bedtime.    . sertraline (ZOLOFT) 100 MG tablet Take 150 mg by mouth daily.    Marland Kitchen tiZANidine (ZANAFLEX) 4 MG tablet Take 1 tablet by mouth at bedtime.    . vitamin E 1000 UNIT capsule Take 1,000 Units by mouth daily.     No current  facility-administered medications for this visit.     Social History   Social History  . Marital status: Divorced    Spouse name: N/A  . Number of children: N/A  . Years of education: N/A   Occupational History  . Not on file.   Social History Main Topics  . Smoking status: Former Smoker    Years: 35.00  . Smokeless tobacco: Never Used  . Alcohol use No  . Drug use: No  . Sexual activity: Yes    Birth control/ protection: Patch     Comment: quit smoking10 /2014   Other Topics Concern  . Not  on file   Social History Narrative  . No narrative on file   Additional social history is notable and that she is divorced for many years.  She has one child one grandchild. She lives with her mother and brother. She has been on disability. She has been smoking intermittently for 20-30 years.  She does not drink alcohol.  She does not routinely exercise.   Family history is notable that her mother is living at age 47 and has diabetes.  Father died at age 52 and had dementia, Parkinson's disease and COPD.  Her brother is 25 and has a neuropathy.  Her child is 37.  ROS General: Negative; No fevers, chills, or night sweats HEENT:  Positive for facial pain; No changes in vision or hearing, sinus congestion, difficulty swallowing Pulmonary: Negative; No cough, wheezing, shortness of breath, hemoptysis Cardiovascular:  See HPI;  GI:  Positive for GERD GU: Negative; No dysuria, hematuria, or difficulty voiding Musculoskeletal:  Positive for cervical disc disease and arthritis. Hematologic/Oncologic: Negative; no easy bruising, bleeding Endocrine: Negative; no heat/cold intolerance; no diabetes Neuro: Negative; no changes in balance, headaches Skin: Negative; No rashes or skin lesions Psychiatric:  Positive for anxiety Sleep: Negative; No daytime sleepiness, hypersomnolence, bruxism, restless legs, hypnogagnic hallucinations Other comprehensive 14 point system review is  negative   Physical Exam BP 110/70 (BP Location: Right Arm, Patient Position: Sitting, Cuff Size: Large)   Pulse 65   Ht _0  (1.6 m)   Wt 197 lb (89.4 kg)   SpO2 99%   BMI 34.90 kg/m    Repeat blood pressure 120/74  Wt Readings from Last 3 Encounters:  07/25/16 197 lb (89.4 kg)  01/24/16 181 lb (82.1 kg)  12/17/15 175 lb 3.2 oz (79.5 kg)   General: Alert, oriented, no distress.  Skin: normal turgor, no rashes, warm and dry HEENT: Normocephalic, atraumatic. Pupils equal round and reactive to light; sclera anicteric; extraocular muscles intact; Fundi without hemorrhages or exudates Nose without nasal septal hypertrophy Mouth/Parynx benign; Mallinpatti scale 2/3 Neck: No JVD, no carotid bruits; normal carotid upstroke Lungs: clear to ausculatation and percussion; no wheezing or rales Chest wall: without tenderness to palpitation Heart: PMI not displaced, RRR, s1 s2 normal, 1/6 systolic murmur, no diastolic murmur, no rubs, gallops, thrills, or heaves Abdomen: soft, nontender; no hepatosplenomehaly, BS+; abdominal aorta nontender and not dilated by palpation. Back: no CVA tenderness Pulses 2+ Musculoskeletal: full range of motion, normal strength, no joint deformities Extremities: no clubbing cyanosis or edema, Homan's sign negative  Neurologic: grossly nonfocal; Cranial nerves grossly wnl Psychologic: Normal mood and affect  ECG (independently read by me): Normal sinus rhythm at 65 bpm.  No ectopy.  Normal intervals.  12/17/2015 ECG (independently read by me):  Normal sinus rhythm at 81 bpm.  Normal intervals.  No ST segment changes.  LABS:  BMP Latest Ref Rng & Units 01/08/2016 06/27/2014  Glucose 65 - 99 mg/dL 74 92  BUN 7 - 25 mg/dL 13 11  Creatinine 0.50 - 0.99 mg/dL 0.64 0.65  Sodium 135 - 146 mmol/L 139 143  Potassium 3.5 - 5.3 mmol/L 3.9 4.2  Chloride 98 - 110 mmol/L 99 103  CO2 20 - 31 mmol/L 30 29  Calcium 8.6 - 10.4 mg/dL 9.5 9.8    Hepatic Function Latest  Ref Rng & Units 01/08/2016  Total Protein 6.1 - 8.1 g/dL 6.6  Albumin 3.6 - 5.1 g/dL 4.5  AST 10 - 35 U/L 24  ALT 6 - 29 U/L  29  Alk Phosphatase 33 - 130 U/L 63  Total Bilirubin 0.2 - 1.2 mg/dL 0.7    CBC Latest Ref Rng & Units 01/08/2016 06/27/2014  WBC 3.8 - 10.8 K/uL 5.6 6.3  Hemoglobin 11.7 - 15.5 g/dL 14.7 13.9  Hematocrit 35.0 - 45.0 % 43.5 40.9  Platelets 140 - 400 K/uL 173 138(L)   Lab Results  Component Value Date   MCV 91.4 01/08/2016   MCV 89.5 06/27/2014   Lab Results  Component Value Date   TSH 0.84 01/08/2016   No results found for: HGBA1C   BNP No results found for: BNP  ProBNP No results found for: PROBNP   Lipid Panel     Component Value Date/Time   CHOL 147 01/08/2016 1102   TRIG 184 (H) 01/08/2016 1102   HDL 46 01/08/2016 1102   CHOLHDL 3.2 01/08/2016 1102   VLDL 37 (H) 01/08/2016 1102   LDLCALC 64 01/08/2016 1102    RADIOLOGY: No results found.   ASSESSMENT AND PLAN:  Bethany Fuller is a 70 year old female who has at least a 10 year history of hypertension and remotely had been treated with felodipine long-term.  She had been off therapy for almost a year and recently developed recurrent blood pressure lability.  When I saw her initially her felodipine had been resumed by her primary physician and her blood pressure was better.  She had experienced episodes of chest pressure-like weight sitting on her chest.  Her echo Doppler study revealed normal systolic function with grade 2 diastolic dysfunction, which may contribute to exertional dyspnea.  Her nuclear perfusion study is low risk and the defect is most likely consistent with breast attenuation artifact.  She had normal wall motion.  There was no ischemia.  Most recently, she is now been on amlodipine 5 mg for hypertension and her blood pressure today is well controlled.  She is on atorvastatin 40 mg for hyperlipidemia.  Laboratory in April 2017 showed a total cluster 147, triglycerides 184,  HDL 46, and LDL 64.  She has been on fish oil 1 capsule daily.  In light of her elevated triglycerides.  I have recommended further titration to 2 capsules daily.  She has GERD which is controlled with Prilosec.  She has a history of depression which is controlled with generic Zoloft.  Her restless legs is controlled with gabapentin.  She denies any painful legs nocturnally with  urge to move.  Her ECG remained stable.  Body mass index is compatible with moderate obesity and weight loss was recommended.  As long as she remains stable, I will see her one year for reevaluation.  Time spent: 25 minutes  Troy Sine, MD, [facility] 07/25/2016 6:27 PM

## 2016-07-25 NOTE — Patient Instructions (Signed)
Your physician wants you to follow-up in: 1 year or sooner if needed. You will receive a reminder letter in the mail two months in advance. If you don't receive a letter, please call our office to schedule the follow-up appointment.   If you need a refill on your cardiac medications before your next appointment, please call your pharmacy.   

## 2016-07-29 ENCOUNTER — Ambulatory Visit: Payer: Medicare Other

## 2016-07-31 DIAGNOSIS — G243 Spasmodic torticollis: Secondary | ICD-10-CM | POA: Diagnosis not present

## 2016-08-20 DIAGNOSIS — E559 Vitamin D deficiency, unspecified: Secondary | ICD-10-CM | POA: Diagnosis not present

## 2016-08-20 DIAGNOSIS — D509 Iron deficiency anemia, unspecified: Secondary | ICD-10-CM | POA: Diagnosis not present

## 2016-08-20 DIAGNOSIS — E785 Hyperlipidemia, unspecified: Secondary | ICD-10-CM | POA: Diagnosis not present

## 2016-08-20 DIAGNOSIS — M8589 Other specified disorders of bone density and structure, multiple sites: Secondary | ICD-10-CM | POA: Diagnosis not present

## 2016-08-20 DIAGNOSIS — Z23 Encounter for immunization: Secondary | ICD-10-CM | POA: Diagnosis not present

## 2016-08-20 DIAGNOSIS — Z6834 Body mass index (BMI) 34.0-34.9, adult: Secondary | ICD-10-CM | POA: Diagnosis not present

## 2016-08-20 DIAGNOSIS — I10 Essential (primary) hypertension: Secondary | ICD-10-CM | POA: Diagnosis not present

## 2016-08-20 DIAGNOSIS — Z1231 Encounter for screening mammogram for malignant neoplasm of breast: Secondary | ICD-10-CM | POA: Diagnosis not present

## 2016-09-16 DIAGNOSIS — Z1231 Encounter for screening mammogram for malignant neoplasm of breast: Secondary | ICD-10-CM | POA: Diagnosis not present

## 2016-09-24 DIAGNOSIS — Z6835 Body mass index (BMI) 35.0-35.9, adult: Secondary | ICD-10-CM | POA: Diagnosis not present

## 2016-09-24 DIAGNOSIS — J208 Acute bronchitis due to other specified organisms: Secondary | ICD-10-CM | POA: Diagnosis not present

## 2016-11-10 DIAGNOSIS — Z7952 Long term (current) use of systemic steroids: Secondary | ICD-10-CM | POA: Diagnosis not present

## 2016-11-10 DIAGNOSIS — Z981 Arthrodesis status: Secondary | ICD-10-CM | POA: Diagnosis not present

## 2016-11-10 DIAGNOSIS — M5481 Occipital neuralgia: Secondary | ICD-10-CM | POA: Diagnosis not present

## 2016-11-10 DIAGNOSIS — G243 Spasmodic torticollis: Secondary | ICD-10-CM | POA: Diagnosis not present

## 2016-11-10 DIAGNOSIS — G2581 Restless legs syndrome: Secondary | ICD-10-CM | POA: Diagnosis not present

## 2016-11-10 DIAGNOSIS — Z79899 Other long term (current) drug therapy: Secondary | ICD-10-CM | POA: Diagnosis not present

## 2016-11-10 DIAGNOSIS — I10 Essential (primary) hypertension: Secondary | ICD-10-CM | POA: Diagnosis not present

## 2016-11-10 DIAGNOSIS — Z87891 Personal history of nicotine dependence: Secondary | ICD-10-CM | POA: Diagnosis not present

## 2016-11-10 DIAGNOSIS — M50322 Other cervical disc degeneration at C5-C6 level: Secondary | ICD-10-CM | POA: Diagnosis not present

## 2016-11-10 DIAGNOSIS — M4802 Spinal stenosis, cervical region: Secondary | ICD-10-CM | POA: Diagnosis not present

## 2017-01-30 DIAGNOSIS — R682 Dry mouth, unspecified: Secondary | ICD-10-CM | POA: Diagnosis not present

## 2017-01-30 DIAGNOSIS — M542 Cervicalgia: Secondary | ICD-10-CM | POA: Diagnosis not present

## 2017-01-30 DIAGNOSIS — K219 Gastro-esophageal reflux disease without esophagitis: Secondary | ICD-10-CM | POA: Diagnosis not present

## 2017-02-09 DIAGNOSIS — I10 Essential (primary) hypertension: Secondary | ICD-10-CM | POA: Diagnosis not present

## 2017-02-09 DIAGNOSIS — Z87891 Personal history of nicotine dependence: Secondary | ICD-10-CM | POA: Diagnosis not present

## 2017-02-09 DIAGNOSIS — Z79899 Other long term (current) drug therapy: Secondary | ICD-10-CM | POA: Diagnosis not present

## 2017-02-09 DIAGNOSIS — G243 Spasmodic torticollis: Secondary | ICD-10-CM | POA: Diagnosis not present

## 2017-02-17 DIAGNOSIS — I10 Essential (primary) hypertension: Secondary | ICD-10-CM | POA: Diagnosis not present

## 2017-02-17 DIAGNOSIS — E559 Vitamin D deficiency, unspecified: Secondary | ICD-10-CM | POA: Diagnosis not present

## 2017-02-17 DIAGNOSIS — E785 Hyperlipidemia, unspecified: Secondary | ICD-10-CM | POA: Diagnosis not present

## 2017-02-17 DIAGNOSIS — D509 Iron deficiency anemia, unspecified: Secondary | ICD-10-CM | POA: Diagnosis not present

## 2017-05-18 DIAGNOSIS — G243 Spasmodic torticollis: Secondary | ICD-10-CM | POA: Diagnosis not present

## 2017-06-12 ENCOUNTER — Telehealth: Payer: Self-pay | Admitting: Cardiovascular Disease

## 2017-06-12 MED ORDER — AMLODIPINE BESYLATE 5 MG PO TABS: ORAL_TABLET | ORAL | 0 refills | Status: DC

## 2017-06-12 NOTE — Telephone Encounter (Signed)
New Message   *STAT* If patient is at the pharmacy, call can be transferred to refill team.   1. Which medications need to be refilled? (please list name of each medication and dose if known) Amlodipine   2. Which pharmacy/location (including street and city if local pharmacy) is medication to be sent to? [facility]   3. Do they need a 30 day or 90 day supply? 90

## 2017-08-19 ENCOUNTER — Ambulatory Visit: Payer: Medicare Other | Admitting: Cardiovascular Disease

## 2017-08-19 ENCOUNTER — Encounter: Payer: Self-pay | Admitting: Cardiovascular Disease

## 2017-08-19 VITALS — BP 112/78 | HR 63 | Ht 63.0 in | Wt 186.6 lb

## 2017-08-19 DIAGNOSIS — E782 Mixed hyperlipidemia: Secondary | ICD-10-CM

## 2017-08-19 DIAGNOSIS — I10 Essential (primary) hypertension: Secondary | ICD-10-CM

## 2017-08-21 ENCOUNTER — Encounter: Payer: Self-pay | Admitting: Cardiovascular Disease

## 2017-08-21 DIAGNOSIS — I10 Essential (primary) hypertension: Secondary | ICD-10-CM | POA: Diagnosis not present

## 2017-08-21 DIAGNOSIS — E785 Hyperlipidemia, unspecified: Secondary | ICD-10-CM | POA: Diagnosis not present

## 2017-08-21 DIAGNOSIS — D509 Iron deficiency anemia, unspecified: Secondary | ICD-10-CM | POA: Diagnosis not present

## 2017-08-21 DIAGNOSIS — E559 Vitamin D deficiency, unspecified: Secondary | ICD-10-CM | POA: Diagnosis not present

## 2017-08-21 NOTE — Progress Notes (Signed)
The patient was not seen today and her appointment will need to be rescheduled.  I was the "STEMI call physician, which commenced at 5:30 PM.  At 5:30 PM, just as I was about to see the patient,  but also the time when STEMI call initiated, I received an emergent call a code STEMI activation on a 71 year old female who presented with inferior STEMI/VF cardiac arrest.  As result, I had to leave the office emergently to perform emergent catheterization and was not able to see Bethany Fuller who unfortunately had been waiting for her yearly evaluation with me today.

## 2017-08-24 DIAGNOSIS — M5481 Occipital neuralgia: Secondary | ICD-10-CM | POA: Diagnosis not present

## 2017-08-24 DIAGNOSIS — G243 Spasmodic torticollis: Secondary | ICD-10-CM | POA: Diagnosis not present

## 2017-08-25 ENCOUNTER — Encounter: Payer: Self-pay | Admitting: Cardiovascular Disease

## 2017-08-25 ENCOUNTER — Ambulatory Visit (INDEPENDENT_AMBULATORY_CARE_PROVIDER_SITE_OTHER): Payer: Medicare Other | Admitting: Cardiovascular Disease

## 2017-08-25 VITALS — BP 118/72 | HR 72 | Ht 63.0 in | Wt 187.0 lb

## 2017-08-25 DIAGNOSIS — I5189 Other ill-defined heart diseases: Secondary | ICD-10-CM

## 2017-08-25 DIAGNOSIS — I10 Essential (primary) hypertension: Secondary | ICD-10-CM

## 2017-08-25 DIAGNOSIS — E782 Mixed hyperlipidemia: Secondary | ICD-10-CM | POA: Diagnosis not present

## 2017-08-25 DIAGNOSIS — E669 Obesity, unspecified: Secondary | ICD-10-CM | POA: Diagnosis not present

## 2017-08-25 DIAGNOSIS — Z79899 Other long term (current) drug therapy: Secondary | ICD-10-CM | POA: Diagnosis not present

## 2017-08-25 MED ORDER — ATORVASTATIN CALCIUM 20 MG PO TABS: 20.0000 mg | ORAL_TABLET | Freq: Every day | ORAL | Status: DC

## 2017-08-25 NOTE — Progress Notes (Signed)
Patient ID: Kameah Rawl, female   DOB: 1946/05/25, 71 y.o.   MRN: [id number]     Primary MD:  Dr. Nelda Bucks  PATIENT PROFILE: Nanna Ertle is a 71 y.o. female who was initially referred through the courtesy of Dr. Ilda Basset for evaluation of significant recent blood pressure lability as well as chest tightness.  I last saw her in October 2017.  She presents for a 13 month follow-up evaluation  HPI:  Teddy Pena has a long-standing history of hypertension for at least 10 years.  Remotely, she had been on felodipine for blood pressure control  And she states that she ultimately was taken off this therapy and had been off this for the past year. She recently had been evaluated and her blood pressure was 162/107 and then when she saw physician at [facility].  Her blood pressure was 152/101. She was recently evaluated I, Dr. Delena Bali on 12/11/2015 and felodipine was reinstituted. Patient had also complained of some mild headaches and dizziness.  He has a history of tobacco use , family history for CAD, as well as hyperlipidemia. She describes her recent chest pain as a tightness associated with shortness of breath with mild radiation to her neck and shoulders her symptoms can occur at any time and are not classically exertionally precipitated. She has had significant difficulty with her mouth and had recently seen a neurologist for facial pain.  When I initially saw her, her ECG was unremarkable.  She underwent laboratory which revealed a normal CBC and chemistry profile.  Her lipid studies revealed mild triglyceride elevation at 184 with a total cholesterol 147, LDL cholesterol 64 and HDL cholesterol 46.  I recommended that we discontinue simvastatin 80 mg but for equal potency changed her to atorvastatin 40 mg, since the element 80 mg dose is no longer recommended and with simvastatin.  She may have dose limitations based on concomitant therapy.  I initiated metoprolol 25 mg  twice a day and recommended that she undergo an echo Doppler study and Myoview scan.  The echo Doppler study from 01/07/2016 showed an EF of 60-65%, and grade 2 diastolic dysfunction with normal wall motion.  There was aortic valve sclerosis.  Her nuclear study did not reveal any ST segment changes.  An atrial septal and inferolateral defect was noted, but this was felt most likely due to attenuation artifact.  There was no ischemia.  She had normal wall motion with an ejection fraction of 58%.  The study was interpreted as low risk.  When I saw her in March 2017 she had run out of felodipine and I started her on amlodipine.  Since I last saw her in October 2017, she has continued to do well on amlodipine 5 mg, and she has been on atorvastatin 40 mg and over-the-counter fish oil for hyperlipidemia.  She also is on record for restless legs and takes sertraline for anxiety.  At times she also takes meloxicam.  Over the past year, she denies any episodes of chest pain, PND, orthopnea.  Yesterday she underwent 17 Botox injections on her head and has some residual discomfort.  She recently underwent a blood work by Dr. Delena Bali and SGOT was 64 with an SGPT of 57.  Total cholesterol was 125, triglycerides 94, HDL 42, and LDL 64.  She presents for reevaluation.   Additional medical problems include history of iron deficiency anemia, seasonal rhinitis, degenerative disease of her cervical vertebral region, osteoarthritis, depression, restless leg syndrome, and urinary  incontinence.  Past Medical History:  Diagnosis Date  . Arthritis   . Cervical dystonia   . Constipation   . Depression   . Family history of anesthesia complication    Mother- nausea  . GERD (gastroesophageal reflux disease)   . Headache(784.0)    Miagrains- now only once a year. Has light migranies from Dystonia  . Hypertension   . Pneumonia    2011  . Shortness of breath    with extertion   . Wears hearing aid    right    Past  Surgical History:  Procedure Laterality Date  . ABDOMINAL HYSTERECTOMY    . CERVICAL FUSION     4, 5, 6  . EXCISION OF TONGUE LESION Right 06/28/2014   Procedure: WIDE LOCAL EXCISION OF THE RIGHT LATERAL TONGUE MASS;  Surgeon: Jerrell Belfast, MD;  Location: [facility];  Service: ENT;  Laterality: Right;    Allergies  Allergen Reactions  . Morphine And Related Itching and Rash  . Sulfa Antibiotics Itching and Rash    Current Outpatient Medications  Medication Sig Dispense Refill  . alendronate (FOSAMAX) 70 MG tablet Take 70 mg by mouth once a week.    Marland Kitchen amLODipine (NORVASC) 5 MG tablet Take 1/2-1 tablet daily per Dr Evette Georges recommendatoins. 90 tablet 0  . atorvastatin (LIPITOR) 20 MG tablet Take 1 tablet (20 mg total) by mouth daily.    . baclofen (LIORESAL) 10 MG tablet Take 20 mg by mouth 3 (three) times daily.    . botulinum toxin Type A (BOTOX) 100 units SOLR injection Inject into the skin.    . calcium carbonate (OS-CAL) 600 MG TABS tablet Take 1,200 mg by mouth daily.    . Calcium-Magnesium-Vitamin D (CALCIUM 1200+D3 PO) Take 1 tablet by mouth daily.    . CHANTIX 1 MG tablet Take 1 mg by mouth 2 (two) times daily.  5  . Cholecalciferol (VITAMIN D-3) 5000 UNITS TABS Take 5,000 Units by mouth daily.    . clonazePAM (KLONOPIN) 0.5 MG tablet Take 1.5 mg by mouth 3 (three) times daily.    Marland Kitchen gabapentin (NEURONTIN) 300 MG capsule Take 300-900 mg by mouth 3 (three) times daily. 600 mg in the morning, 300 mg at lunch and 1200 mg at night    . meclizine (ANTIVERT) 25 MG tablet Take 25 mg by mouth daily. Scheduled 1 time a day.    . meloxicam (MOBIC) 15 MG tablet Take 15 mg by mouth daily.    . Multiple Vitamin (MULTIVITAMIN WITH MINERALS) TABS tablet Take 1 tablet by mouth daily.    . Multiple Vitamins-Minerals (HAIR/SKIN/NAILS PO) Take 3 tablets by mouth daily.    . Omega-3 Fatty Acids (FISH OIL) 1200 MG CAPS Take 1,200 mg by mouth daily.    Marland Kitchen omeprazole (PRILOSEC) 20 MG capsule Take 20 mg by  mouth daily.    . OnabotulinumtoxinA (BOTOX IJ) Inject as directed every 3 (three) months.    . pilocarpine (SALAGEN) 5 MG tablet Take 5 mg by mouth 3 (three) times daily.  12  . polyethylene glycol (MIRALAX / GLYCOLAX) packet Take 17 g by mouth as needed.    . Pyridoxine HCl (VITAMIN B-6 PO) Take 1 tablet by mouth daily.    Marland Kitchen rOPINIRole (REQUIP) 0.25 MG tablet Take 1 mg by mouth at bedtime.    . sertraline (ZOLOFT) 100 MG tablet Take 150 mg by mouth daily.    Marland Kitchen tiZANidine (ZANAFLEX) 4 MG tablet Take 1 tablet by mouth at bedtime.    Marland Kitchen  vitamin E 1000 UNIT capsule Take 1,000 Units by mouth daily.     No current facility-administered medications for this visit.     Social History   Socioeconomic History  . Marital status: Divorced    Spouse name: Not on file  . Number of children: Not on file  . Years of education: Not on file  . Highest education level: Not on file  Social Needs  . Financial resource strain: Not on file  . Food insecurity - worry: Not on file  . Food insecurity - inability: Not on file  . Transportation needs - medical: Not on file  . Transportation needs - non-medical: Not on file  Occupational History  . Not on file  Tobacco Use  . Smoking status: Former Smoker    Years: 35.00  . Smokeless tobacco: Never Used  Substance and Sexual Activity  . Alcohol use: No  . Drug use: No  . Sexual activity: Yes    Birth control/protection: Patch    Comment: quit smoking10 /2014  Other Topics Concern  . Not on file  Social History Narrative  . Not on file   Additional social history is notable and that she is divorced for many years.  She has one child one grandchild. She lives with her mother and brother. She has been on disability. She has been smoking intermittently for 20-30 years.  She does not drink alcohol.  She does not routinely exercise.   Family history is notable that her mother is living at age 81 and has diabetes.  Father died at age 61 and had dementia,  Parkinson's disease and COPD.  Her brother is 30 and has a neuropathy.  Her child is 50.  ROS General: Negative; No fevers, chills, or night sweats HEENT:  Positive for facial pain; No changes in vision or hearing, sinus congestion, difficulty swallowing Pulmonary: Negative; No cough, wheezing, shortness of breath, hemoptysis Cardiovascular:  See HPI;  GI:  Positive for GERD GU: Negative; No dysuria, hematuria, or difficulty voiding Musculoskeletal:  Positive for cervical disc disease and arthritis. Hematologic/Oncologic: Negative; no easy bruising, bleeding Endocrine: Negative; no heat/cold intolerance; no diabetes Neuro: Negative; no changes in balance, headaches Skin: Negative; No rashes or skin lesions Psychiatric:  Positive for anxiety Sleep: Negative; No daytime sleepiness, hypersomnolence, bruxism, restless legs, hypnogagnic hallucinations Other comprehensive 14 point system review is negative   Physical Exam BP 118/72   Pulse 72   Ht _0  (1.6 m)   Wt 187 lb (84.8 kg)   BMI 33.13 kg/m    Repeat blood pressure by me was 120/66  Wt Readings from Last 3 Encounters:  08/25/17 187 lb (84.8 kg)  08/19/17 186 lb 9.6 oz (84.6 kg)  07/25/16 197 lb (89.4 kg)   General: Alert, oriented, no distress.  Skin: normal turgor, no rashes, warm and dry HEENT: Normocephalic, atraumatic. Pupils equal round and reactive to light; sclera anicteric; extraocular muscles intact;  Nose without nasal septal hypertrophy Mouth/Parynx benign; Mallinpatti scale 3 Neck: No JVD, no carotid bruits; normal carotid upstroke Lungs: clear to ausculatation and percussion; no wheezing or rales Chest wall: without tenderness to palpitation Heart: PMI not displaced, RRR, s1 s2 normal, 1/6 systolic murmur, no diastolic murmur, no rubs, gallops, thrills, or heaves Abdomen: Mild central adiposity, soft, nontender; no hepatosplenomehaly, BS+; abdominal aorta nontender and not dilated by palpation. Back: no CVA  tenderness Pulses 2+ Musculoskeletal: full range of motion, normal strength, no joint deformities Extremities: no clubbing cyanosis or edema,  Homan's sign negative  Neurologic: grossly nonfocal; Cranial nerves grossly wnl Psychologic: Normal mood and affect   ECG (independently read by me): Normal sinus rhythm at 72 bpm.  Normal intervals.  No ST segment changes.  October 2017 ECG (independently read by me): Normal sinus rhythm at 65 bpm.  No ectopy.  Normal intervals.  12/17/2015 ECG (independently read by me):  Normal sinus rhythm at 81 bpm.  Normal intervals.  No ST segment changes.  LABS: I recently reviewed the lab work from Dr. Delena Bali from 08/22/2017.  BMP Latest Ref Rng & Units 01/08/2016 06/27/2014  Glucose 65 - 99 mg/dL 74 92  BUN 7 - 25 mg/dL 13 11  Creatinine 0.50 - 0.99 mg/dL 0.64 0.65  Sodium 135 - 146 mmol/L 139 143  Potassium 3.5 - 5.3 mmol/L 3.9 4.2  Chloride 98 - 110 mmol/L 99 103  CO2 20 - 31 mmol/L 30 29  Calcium 8.6 - 10.4 mg/dL 9.5 9.8    Hepatic Function Latest Ref Rng & Units 01/08/2016  Total Protein 6.1 - 8.1 g/dL 6.6  Albumin 3.6 - 5.1 g/dL 4.5  AST 10 - 35 U/L 24  ALT 6 - 29 U/L 29  Alk Phosphatase 33 - 130 U/L 63  Total Bilirubin 0.2 - 1.2 mg/dL 0.7    CBC Latest Ref Rng & Units 01/08/2016 06/27/2014  WBC 3.8 - 10.8 K/uL 5.6 6.3  Hemoglobin 11.7 - 15.5 g/dL 14.7 13.9  Hematocrit 35.0 - 45.0 % 43.5 40.9  Platelets 140 - 400 K/uL 173 138(L)   Lab Results  Component Value Date   MCV 91.4 01/08/2016   MCV 89.5 06/27/2014   Lab Results  Component Value Date   TSH 0.84 01/08/2016   No results found for: HGBA1C   BNP No results found for: BNP  ProBNP No results found for: PROBNP   Lipid Panel     Component Value Date/Time   CHOL 147 01/08/2016 1102   TRIG 184 (H) 01/08/2016 1102   HDL 46 01/08/2016 1102   CHOLHDL 3.2 01/08/2016 1102   VLDL 37 (H) 01/08/2016 1102   LDLCALC 64 01/08/2016 1102    RADIOLOGY: No results  found.  IMPRESSION:  1. Essential hypertension   2. Medication management   3. Mixed hyperlipidemia   4. Mild obesity   5. Diastolic dysfunction without heart failure     ASSESSMENT AND PLAN:  Ms. Danita Proud is a 71 year old female who has a history of hypertension and hyperlipidemia.  Remotely she had been on felodipine for blood pressure control, but ultimately was switched to amlodipine.  Her blood pressure today continues to be well controlled on amlodipine 5 mg daily.  Remotely she had issues with stage II hypertension off blood pressure medication.  She has been documented have normal systolic function with grade 2 diastolic dysfunction as well as aortic valve sclerosis without stenosis.  She has been on atorvastatin 40 mg daily, but with her recent LFT elevation noted on laboratory done by Dr. Delena Bali, I have suggested that she reduce her atorvastatin down to 20 mg.  She may very well have mild fatty liver as a contributor, but this has not been assessed.  BMI is 33.13.  She has a history of anxiety, which is controlled with sertraline 100 mg daily.  She's not having any chest discomfort.  Her restless legs is well-controlled with ropinirole. .  We discussed the importance of exercise and weight loss.  I have recommended repeat laboratory be obtained with a  chemistry profile, and lipid studies in 2 months and have urged improved diet, particularly with the reduction of her atorvastatin dose.  As long as she remains stable, I will see her in one year for reevaluation.  Time spent: 25 minutes  Troy Sine, MD, [facility] 08/25/2017 7:14 PM

## 2017-08-25 NOTE — Patient Instructions (Signed)
Medication Instructions: DECREASE the Atorvastatin to 20 mg tablet daily  If you need a refill on your cardiac medications before your next appointment, please call your pharmacy.   Labwork: Your physician recommends that you return for lab work in: 2 months for a fasting lipid and hepatic lab   Follow-Up: Your physician wants you to follow-up in: 12 months with Dr. Tresa EndoKelly. You will receive a reminder letter in the mail two months in advance. If you don't receive a letter, please call our office at [phone] to schedule this follow-up appointment.   Thank you for choosing [facility] at [facility]!!

## 2017-08-27 DIAGNOSIS — L820 Inflamed seborrheic keratosis: Secondary | ICD-10-CM | POA: Diagnosis not present

## 2017-08-27 DIAGNOSIS — C4441 Basal cell carcinoma of skin of scalp and neck: Secondary | ICD-10-CM | POA: Diagnosis not present

## 2017-09-01 ENCOUNTER — Other Ambulatory Visit: Payer: Self-pay | Admitting: *Deleted

## 2017-09-01 MED ORDER — AMLODIPINE BESYLATE 5 MG PO TABS: ORAL_TABLET | ORAL | 3 refills | Status: DC

## 2017-09-18 DIAGNOSIS — Z1231 Encounter for screening mammogram for malignant neoplasm of breast: Secondary | ICD-10-CM | POA: Diagnosis not present

## 2017-09-28 ENCOUNTER — Other Ambulatory Visit: Payer: Self-pay | Admitting: *Deleted

## 2017-09-28 MED ORDER — ATORVASTATIN CALCIUM 20 MG PO TABS: 20.0000 mg | ORAL_TABLET | Freq: Every day | ORAL | 3 refills | Status: DC

## 2017-10-01 DIAGNOSIS — I959 Hypotension, unspecified: Secondary | ICD-10-CM | POA: Diagnosis not present

## 2017-10-01 DIAGNOSIS — R197 Diarrhea, unspecified: Secondary | ICD-10-CM | POA: Diagnosis not present

## 2017-10-01 DIAGNOSIS — E860 Dehydration: Secondary | ICD-10-CM | POA: Diagnosis not present

## 2017-10-07 ENCOUNTER — Other Ambulatory Visit: Payer: Self-pay | Admitting: Cardiovascular Disease

## 2017-10-07 MED ORDER — ATORVASTATIN CALCIUM 20 MG PO TABS: 20.0000 mg | ORAL_TABLET | Freq: Every day | ORAL | 3 refills | Status: DC

## 2017-10-07 NOTE — Telephone Encounter (Signed)
°*  STAT* If patient is at the pharmacy, call can be transferred to refill team.   1. Which medications need to be refilled? (please list name of each medication and dose if known) need a new prescription for Atorvastatin  2. Which pharmacy/location (including street and city if local pharmacy) is medication to be sent to?[facility]Fax is [phone] 3. Do they need a 30 day or 90 day supply? 90 and refills

## 2017-10-21 ENCOUNTER — Other Ambulatory Visit: Payer: Self-pay | Admitting: Cardiovascular Disease

## 2017-10-28 ENCOUNTER — Telehealth: Payer: Self-pay | Admitting: Cardiovascular Disease

## 2017-10-28 MED ORDER — ATORVASTATIN CALCIUM 20 MG PO TABS: 20.0000 mg | ORAL_TABLET | Freq: Every day | ORAL | 3 refills | Status: DC

## 2017-10-28 NOTE — Telephone Encounter (Signed)
Left message to call back  

## 2017-10-28 NOTE — Telephone Encounter (Signed)
Atorvastatin 20 mg has been refilled for the patient to [facility]

## 2017-10-28 NOTE — Telephone Encounter (Signed)
Pt c/o medication issue:  1. Name of Medication: Atorvastatin   2. How are you currently taking this medication (dosage and times per day)? 40 mg  3. Are you having a reaction (difficulty breathing--STAT)?   4. What is your medication issue? Patient is having trouble getting medication refilled. Patient would like to speak with nurse

## 2017-10-28 NOTE — Telephone Encounter (Deleted)
Patient calling ,  Pt c/o medication issue:  1. Name of Medication: ***  2. How are you currently taking this medication (dosage and times per day)? ***  3. Are you having a reaction (difficulty breathing--STAT)? ***  4. What is your medication issue? ***

## 2017-11-04 DIAGNOSIS — L820 Inflamed seborrheic keratosis: Secondary | ICD-10-CM | POA: Diagnosis not present

## 2017-11-04 DIAGNOSIS — D225 Melanocytic nevi of trunk: Secondary | ICD-10-CM | POA: Diagnosis not present

## 2017-11-10 DIAGNOSIS — I10 Essential (primary) hypertension: Secondary | ICD-10-CM | POA: Diagnosis not present

## 2017-11-10 DIAGNOSIS — E785 Hyperlipidemia, unspecified: Secondary | ICD-10-CM | POA: Diagnosis not present

## 2017-11-10 DIAGNOSIS — Z79899 Other long term (current) drug therapy: Secondary | ICD-10-CM | POA: Diagnosis not present

## 2017-11-11 LAB — HEPATIC FUNCTION PANEL
ALT: 37 [IU]/L — ABNORMAL HIGH (ref 0–32)
AST: 34 [IU]/L (ref 0–40)
Albumin: 4.4 g/dL (ref 3.6–4.8)
Alkaline Phosphatase: 76 [IU]/L (ref 39–117)
Bilirubin Total: 0.4 mg/dL (ref 0.0–1.2)
Bilirubin, Direct: 0.1 mg/dL (ref 0.00–0.40)
Total Protein: 6.5 g/dL (ref 6.0–8.5)

## 2017-11-11 LAB — LIPID PANEL
Chol/HDL Ratio: 4.5 ratio — ABNORMAL HIGH (ref 0.0–4.4)
Cholesterol, Total: 212 mg/dL — ABNORMAL HIGH (ref 100–199)
HDL: 47 mg/dL
LDL Calculated: 130 mg/dL — ABNORMAL HIGH (ref 0–99)
Triglycerides: 175 mg/dL — ABNORMAL HIGH (ref 0–149)
VLDL Cholesterol Cal: 35 mg/dL (ref 5–40)

## 2017-11-18 ENCOUNTER — Other Ambulatory Visit: Payer: Self-pay | Admitting: *Deleted

## 2017-11-18 DIAGNOSIS — Z79899 Other long term (current) drug therapy: Secondary | ICD-10-CM

## 2017-11-18 DIAGNOSIS — E785 Hyperlipidemia, unspecified: Secondary | ICD-10-CM

## 2017-11-18 MED ORDER — EZETIMIBE 10 MG PO TABS: 10.0000 mg | ORAL_TABLET | Freq: Every day | ORAL | 3 refills | Status: DC

## 2017-11-20 ENCOUNTER — Telehealth: Payer: Self-pay | Admitting: Cardiovascular Disease

## 2017-11-20 NOTE — Telephone Encounter (Signed)
Spoke to patient, aware of results and recommendations and verbalized understanding.    Lab orders have been mailed to patient

## 2017-11-20 NOTE — Telephone Encounter (Signed)
Follow up    [facility]

## 2017-11-23 DIAGNOSIS — G243 Spasmodic torticollis: Secondary | ICD-10-CM | POA: Diagnosis not present

## 2017-12-21 DIAGNOSIS — H04123 Dry eye syndrome of bilateral lacrimal glands: Secondary | ICD-10-CM | POA: Diagnosis not present

## 2018-01-05 DIAGNOSIS — H04123 Dry eye syndrome of bilateral lacrimal glands: Secondary | ICD-10-CM | POA: Diagnosis not present

## 2018-01-13 ENCOUNTER — Other Ambulatory Visit: Payer: Self-pay | Admitting: Cardiovascular Disease

## 2018-01-13 IMAGING — NM NM MISC PROCEDURE
3 series · 18 of 18 positions shown · non-contrast
Comparison: none

[Series 1: rest_(id)_sa · 6.4mm · 6.40mm/px · 6 of 64 frames shown]
[frame 6/64]
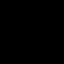
[frame 16/64]
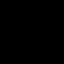
[frame 27/64]
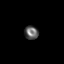
[frame 38/64]
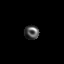
[frame 48/64]
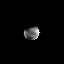
[frame 59/64]
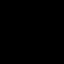

[Series 1: stress-gsp_(id)_sa · 6.4mm · 6.40mm/px · 6 of 512 frames shown]
[frame 43/512]
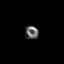
[frame 128/512]
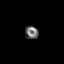
[frame 214/512]
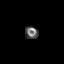
[frame 299/512]
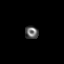
[frame 384/512]
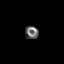
[frame 470/512]
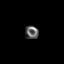

[Series 1: stress-sum-em_(id)_sa · 6.4mm · 6.40mm/px · 6 of 64 frames shown]
[frame 6/64]
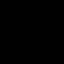
[frame 16/64]
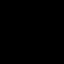
[frame 27/64]
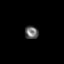
[frame 38/64]
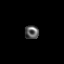
[frame 48/64]
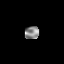
[frame 59/64]
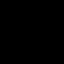

[18 of 18 positions shown; findings below may reference images not displayed]

Canned report from images found in remote index.

Refer to host system for actual result text.

## 2018-01-13 MED ORDER — AMLODIPINE BESYLATE 5 MG PO TABS: ORAL_TABLET | ORAL | 3 refills | Status: DC

## 2018-02-18 DIAGNOSIS — E785 Hyperlipidemia, unspecified: Secondary | ICD-10-CM | POA: Diagnosis not present

## 2018-02-18 DIAGNOSIS — D509 Iron deficiency anemia, unspecified: Secondary | ICD-10-CM | POA: Diagnosis not present

## 2018-02-18 DIAGNOSIS — E559 Vitamin D deficiency, unspecified: Secondary | ICD-10-CM | POA: Diagnosis not present

## 2018-02-18 DIAGNOSIS — I10 Essential (primary) hypertension: Secondary | ICD-10-CM | POA: Diagnosis not present

## 2018-03-01 DIAGNOSIS — Z885 Allergy status to narcotic agent status: Secondary | ICD-10-CM | POA: Diagnosis not present

## 2018-03-01 DIAGNOSIS — E785 Hyperlipidemia, unspecified: Secondary | ICD-10-CM | POA: Diagnosis not present

## 2018-03-01 DIAGNOSIS — I10 Essential (primary) hypertension: Secondary | ICD-10-CM | POA: Diagnosis not present

## 2018-03-01 DIAGNOSIS — Z882 Allergy status to sulfonamides status: Secondary | ICD-10-CM | POA: Diagnosis not present

## 2018-03-01 DIAGNOSIS — Z87891 Personal history of nicotine dependence: Secondary | ICD-10-CM | POA: Diagnosis not present

## 2018-03-01 DIAGNOSIS — Z981 Arthrodesis status: Secondary | ICD-10-CM | POA: Diagnosis not present

## 2018-03-01 DIAGNOSIS — M5481 Occipital neuralgia: Secondary | ICD-10-CM | POA: Diagnosis not present

## 2018-03-01 DIAGNOSIS — G243 Spasmodic torticollis: Secondary | ICD-10-CM | POA: Diagnosis not present

## 2018-04-02 DIAGNOSIS — A932 Colorado tick fever: Secondary | ICD-10-CM | POA: Diagnosis not present

## 2018-04-05 DIAGNOSIS — E785 Hyperlipidemia, unspecified: Secondary | ICD-10-CM | POA: Diagnosis not present

## 2018-04-05 DIAGNOSIS — Z79899 Other long term (current) drug therapy: Secondary | ICD-10-CM | POA: Diagnosis not present

## 2018-04-06 LAB — LIPID PANEL
CHOL/HDL RATIO: 2.7 ratio (ref 0.0–4.4)
Cholesterol, Total: 123 mg/dL (ref 100–199)
HDL: 46 mg/dL (ref >39)
LDL CALC: 52 mg/dL (ref 0–99)
Triglycerides: 124 mg/dL (ref 0–149)
VLDL CHOLESTEROL CAL: 25 mg/dL (ref 5–40)

## 2018-04-06 LAB — HEPATIC FUNCTION PANEL
ALBUMIN: 4.5 g/dL (ref 3.5–4.8)
ALT: 26 IU/L (ref 0–32)
AST: 25 IU/L (ref 0–40)
Alkaline Phosphatase: 75 IU/L (ref 39–117)
BILIRUBIN TOTAL: 0.4 mg/dL (ref 0.0–1.2)
BILIRUBIN, DIRECT: 0.12 mg/dL (ref 0.00–0.40)
Total Protein: 6.6 g/dL (ref 6.0–8.5)

## 2018-04-28 DIAGNOSIS — H524 Presbyopia: Secondary | ICD-10-CM | POA: Diagnosis not present

## 2018-04-28 DIAGNOSIS — H04123 Dry eye syndrome of bilateral lacrimal glands: Secondary | ICD-10-CM | POA: Diagnosis not present

## 2018-05-30 DIAGNOSIS — K219 Gastro-esophageal reflux disease without esophagitis: Secondary | ICD-10-CM | POA: Diagnosis not present

## 2018-06-07 DIAGNOSIS — Z981 Arthrodesis status: Secondary | ICD-10-CM | POA: Diagnosis not present

## 2018-06-07 DIAGNOSIS — G243 Spasmodic torticollis: Secondary | ICD-10-CM | POA: Diagnosis not present

## 2018-06-07 DIAGNOSIS — Z882 Allergy status to sulfonamides status: Secondary | ICD-10-CM | POA: Diagnosis not present

## 2018-06-07 DIAGNOSIS — G2581 Restless legs syndrome: Secondary | ICD-10-CM | POA: Diagnosis not present

## 2018-06-07 DIAGNOSIS — Z885 Allergy status to narcotic agent status: Secondary | ICD-10-CM | POA: Diagnosis not present

## 2018-06-07 DIAGNOSIS — F17210 Nicotine dependence, cigarettes, uncomplicated: Secondary | ICD-10-CM | POA: Diagnosis not present

## 2018-06-07 DIAGNOSIS — I10 Essential (primary) hypertension: Secondary | ICD-10-CM | POA: Diagnosis not present

## 2018-06-07 DIAGNOSIS — M4312 Spondylolisthesis, cervical region: Secondary | ICD-10-CM | POA: Diagnosis not present

## 2018-06-07 DIAGNOSIS — Z79899 Other long term (current) drug therapy: Secondary | ICD-10-CM | POA: Diagnosis not present

## 2018-06-07 DIAGNOSIS — R51 Headache: Secondary | ICD-10-CM | POA: Diagnosis not present

## 2018-06-07 DIAGNOSIS — G119 Hereditary ataxia, unspecified: Secondary | ICD-10-CM | POA: Diagnosis not present

## 2018-06-07 DIAGNOSIS — E785 Hyperlipidemia, unspecified: Secondary | ICD-10-CM | POA: Diagnosis not present

## 2018-06-15 DIAGNOSIS — G243 Spasmodic torticollis: Secondary | ICD-10-CM | POA: Diagnosis not present

## 2018-06-15 DIAGNOSIS — R201 Hypoesthesia of skin: Secondary | ICD-10-CM | POA: Diagnosis not present

## 2018-06-15 DIAGNOSIS — M6281 Muscle weakness (generalized): Secondary | ICD-10-CM | POA: Diagnosis not present

## 2018-06-15 DIAGNOSIS — G119 Hereditary ataxia, unspecified: Secondary | ICD-10-CM | POA: Diagnosis not present

## 2018-06-17 DIAGNOSIS — G119 Hereditary ataxia, unspecified: Secondary | ICD-10-CM | POA: Diagnosis not present

## 2018-06-17 DIAGNOSIS — M6281 Muscle weakness (generalized): Secondary | ICD-10-CM | POA: Diagnosis not present

## 2018-06-17 DIAGNOSIS — G243 Spasmodic torticollis: Secondary | ICD-10-CM | POA: Diagnosis not present

## 2018-06-17 DIAGNOSIS — R201 Hypoesthesia of skin: Secondary | ICD-10-CM | POA: Diagnosis not present

## 2018-06-22 ENCOUNTER — Encounter: Payer: Self-pay | Admitting: Cardiovascular Disease

## 2018-06-22 DIAGNOSIS — E785 Hyperlipidemia, unspecified: Secondary | ICD-10-CM | POA: Diagnosis not present

## 2018-06-22 DIAGNOSIS — I10 Essential (primary) hypertension: Secondary | ICD-10-CM | POA: Diagnosis not present

## 2018-06-22 DIAGNOSIS — E559 Vitamin D deficiency, unspecified: Secondary | ICD-10-CM | POA: Diagnosis not present

## 2018-06-22 DIAGNOSIS — D509 Iron deficiency anemia, unspecified: Secondary | ICD-10-CM | POA: Diagnosis not present

## 2018-06-22 DIAGNOSIS — S060X9A Concussion with loss of consciousness of unspecified duration, initial encounter: Secondary | ICD-10-CM | POA: Diagnosis not present

## 2018-06-23 DIAGNOSIS — G119 Hereditary ataxia, unspecified: Secondary | ICD-10-CM | POA: Diagnosis not present

## 2018-06-23 DIAGNOSIS — M6281 Muscle weakness (generalized): Secondary | ICD-10-CM | POA: Diagnosis not present

## 2018-06-23 DIAGNOSIS — G243 Spasmodic torticollis: Secondary | ICD-10-CM | POA: Diagnosis not present

## 2018-06-23 DIAGNOSIS — R201 Hypoesthesia of skin: Secondary | ICD-10-CM | POA: Diagnosis not present

## 2018-06-25 DIAGNOSIS — R201 Hypoesthesia of skin: Secondary | ICD-10-CM | POA: Diagnosis not present

## 2018-06-25 DIAGNOSIS — G119 Hereditary ataxia, unspecified: Secondary | ICD-10-CM | POA: Diagnosis not present

## 2018-06-25 DIAGNOSIS — G243 Spasmodic torticollis: Secondary | ICD-10-CM | POA: Diagnosis not present

## 2018-06-25 DIAGNOSIS — M6281 Muscle weakness (generalized): Secondary | ICD-10-CM | POA: Diagnosis not present

## 2018-06-30 DIAGNOSIS — R201 Hypoesthesia of skin: Secondary | ICD-10-CM | POA: Diagnosis not present

## 2018-06-30 DIAGNOSIS — M6281 Muscle weakness (generalized): Secondary | ICD-10-CM | POA: Diagnosis not present

## 2018-06-30 DIAGNOSIS — G119 Hereditary ataxia, unspecified: Secondary | ICD-10-CM | POA: Diagnosis not present

## 2018-06-30 DIAGNOSIS — R42 Dizziness and giddiness: Secondary | ICD-10-CM | POA: Diagnosis not present

## 2018-06-30 DIAGNOSIS — G243 Spasmodic torticollis: Secondary | ICD-10-CM | POA: Diagnosis not present

## 2018-06-30 DIAGNOSIS — S060X9A Concussion with loss of consciousness of unspecified duration, initial encounter: Secondary | ICD-10-CM | POA: Diagnosis not present

## 2018-06-30 DIAGNOSIS — R51 Headache: Secondary | ICD-10-CM | POA: Diagnosis not present

## 2018-07-09 DIAGNOSIS — M6281 Muscle weakness (generalized): Secondary | ICD-10-CM | POA: Diagnosis not present

## 2018-07-09 DIAGNOSIS — G119 Hereditary ataxia, unspecified: Secondary | ICD-10-CM | POA: Diagnosis not present

## 2018-07-09 DIAGNOSIS — R201 Hypoesthesia of skin: Secondary | ICD-10-CM | POA: Diagnosis not present

## 2018-07-09 DIAGNOSIS — G243 Spasmodic torticollis: Secondary | ICD-10-CM | POA: Diagnosis not present

## 2018-07-15 DIAGNOSIS — R201 Hypoesthesia of skin: Secondary | ICD-10-CM | POA: Diagnosis not present

## 2018-07-15 DIAGNOSIS — M6281 Muscle weakness (generalized): Secondary | ICD-10-CM | POA: Diagnosis not present

## 2018-07-15 DIAGNOSIS — G243 Spasmodic torticollis: Secondary | ICD-10-CM | POA: Diagnosis not present

## 2018-07-15 DIAGNOSIS — G119 Hereditary ataxia, unspecified: Secondary | ICD-10-CM | POA: Diagnosis not present

## 2018-07-29 DIAGNOSIS — M6281 Muscle weakness (generalized): Secondary | ICD-10-CM | POA: Diagnosis not present

## 2018-07-29 DIAGNOSIS — G119 Hereditary ataxia, unspecified: Secondary | ICD-10-CM | POA: Diagnosis not present

## 2018-07-29 DIAGNOSIS — R201 Hypoesthesia of skin: Secondary | ICD-10-CM | POA: Diagnosis not present

## 2018-07-29 DIAGNOSIS — G243 Spasmodic torticollis: Secondary | ICD-10-CM | POA: Diagnosis not present

## 2018-08-04 DIAGNOSIS — R201 Hypoesthesia of skin: Secondary | ICD-10-CM | POA: Diagnosis not present

## 2018-08-04 DIAGNOSIS — G243 Spasmodic torticollis: Secondary | ICD-10-CM | POA: Diagnosis not present

## 2018-08-04 DIAGNOSIS — G119 Hereditary ataxia, unspecified: Secondary | ICD-10-CM | POA: Diagnosis not present

## 2018-08-10 DIAGNOSIS — G243 Spasmodic torticollis: Secondary | ICD-10-CM | POA: Diagnosis not present

## 2018-08-10 DIAGNOSIS — G119 Hereditary ataxia, unspecified: Secondary | ICD-10-CM | POA: Diagnosis not present

## 2018-08-10 DIAGNOSIS — B351 Tinea unguium: Secondary | ICD-10-CM | POA: Diagnosis not present

## 2018-08-10 DIAGNOSIS — R201 Hypoesthesia of skin: Secondary | ICD-10-CM | POA: Diagnosis not present

## 2018-08-10 DIAGNOSIS — Z23 Encounter for immunization: Secondary | ICD-10-CM | POA: Diagnosis not present

## 2018-08-10 DIAGNOSIS — Z1231 Encounter for screening mammogram for malignant neoplasm of breast: Secondary | ICD-10-CM | POA: Diagnosis not present

## 2018-08-10 DIAGNOSIS — Z79899 Other long term (current) drug therapy: Secondary | ICD-10-CM | POA: Diagnosis not present

## 2018-09-06 ENCOUNTER — Encounter: Payer: Self-pay | Admitting: Cardiovascular Disease

## 2018-09-06 ENCOUNTER — Ambulatory Visit: Payer: Medicare Other | Admitting: Cardiovascular Disease

## 2018-09-06 VITALS — BP 124/86 | HR 69 | Ht 63.0 in | Wt 189.4 lb

## 2018-09-06 DIAGNOSIS — I5189 Other ill-defined heart diseases: Secondary | ICD-10-CM

## 2018-09-06 DIAGNOSIS — E785 Hyperlipidemia, unspecified: Secondary | ICD-10-CM | POA: Diagnosis not present

## 2018-09-06 DIAGNOSIS — R0609 Other forms of dyspnea: Secondary | ICD-10-CM

## 2018-09-06 DIAGNOSIS — E669 Obesity, unspecified: Secondary | ICD-10-CM

## 2018-09-06 DIAGNOSIS — R0600 Dyspnea, unspecified: Secondary | ICD-10-CM

## 2018-09-06 DIAGNOSIS — I10 Essential (primary) hypertension: Secondary | ICD-10-CM | POA: Diagnosis not present

## 2018-09-06 MED ORDER — METOPROLOL SUCCINATE ER 25 MG PO TB24: 25.0000 mg | ORAL_TABLET | Freq: Every day | ORAL | 3 refills | Status: DC

## 2018-09-06 NOTE — Progress Notes (Signed)
Patient ID: Bethany Fuller, female   DOB: Mar 26, 1946, 72 y.o.   MRN: [id number]     Primary MD:  Dr. Nelda Bucks  PATIENT PROFILE: Bethany Fuller is a 72 y.o. female who was initially referred through the courtesy of Dr. Ilda Basset for evaluation of significant  blood pressure lability as well as chest tightness.  I last saw her in November 2018.  She presents for one-year evaluation.   HPI:  Bethany Fuller has a long-standing history of hypertension for at least 10 years.  Remotely, she had been on felodipine for blood pressure control  And she states that she ultimately was taken off this therapy and had been off this for the past year. She recently had been evaluated and her blood pressure was 162/107 and then when she saw physician at [facility].  Her blood pressure was 152/101. She was recently evaluated I, Dr. Delena Bali on 12/11/2015 and felodipine was reinstituted. Patient had also complained of some mild headaches and dizziness.  He has a history of tobacco use , family history for CAD, as well as hyperlipidemia. She describes her recent chest pain as a tightness associated with shortness of breath with mild radiation to her neck and shoulders her symptoms can occur at any time and are not classically exertionally precipitated. She has had significant difficulty with her mouth and had recently seen a neurologist for facial pain.  When I initially saw her, her ECG was unremarkable.  She underwent laboratory which revealed a normal CBC and chemistry profile.  Her lipid studies revealed mild triglyceride elevation at 184 with a total cholesterol 147, LDL cholesterol 64 and HDL cholesterol 46.  I recommended that we discontinue simvastatin 80 mg but for equal potency changed her to atorvastatin 40 mg, since the element 80 mg dose is no longer recommended and with simvastatin.  She may have dose limitations based on concomitant therapy.  I initiated metoprolol 25 mg twice a day  and recommended that she undergo an echo Doppler study and Myoview scan.  The echo Doppler study from 01/07/2016 showed an EF of 60-65%, and grade 2 diastolic dysfunction with normal wall motion.  There was aortic valve sclerosis.  Her nuclear study did not reveal any ST segment changes.  An atrial septal and inferolateral defect was noted, but this was felt most likely due to attenuation artifact.  There was no ischemia.  She had normal wall motion with an ejection fraction of 58%.  The study was interpreted as low risk.  When I saw her in March 2017 she had run out of felodipine and I started her on amlodipine.  In  October 2017, she continued to do well on amlodipine 5 mg, and she has been on atorvastatin 40 mg and over-the-counter fish oil for hyperlipidemia.  She also is on record for restless legs and takes sertraline for anxiety.  At times she also takes meloxicam.  I saw her in November 2018 at which time she remained stable and denied episodes of chest pain, PND orthopnea.  She undergoes Botox treatments for her dystonia   She  underwent a blood work by Dr. Delena Bali and SGOT was 64 with an SGPT of 57.  Total cholesterol was 125, triglycerides 94, HDL 42, and LDL 64.  She presents for reevaluation.   Additional medical problems include history of iron deficiency anemia, seasonal rhinitis, degenerative disease of her cervical vertebral region, osteoarthritis, depression, restless leg syndrome, and urinary incontinence.  Since I saw her  in November 2018, she continues to remain fairly stable from a cardiac standpoint.  She again underwent Botox injections last week for her dystonia.  She admits to episodes of exertional dyspnea.  She denies palpitations but admits that her pulse speeds up fast with activity.  She has been on amlodipine, atorvastatin 20 mg, Zetia 10 mg, in addition to fish oil.  She is on Fosamax for osteoporosis. She presents for evaluation.  Past Medical History:  Diagnosis Date  .  Arthritis   . Cervical dystonia   . Constipation   . Depression   . Family history of anesthesia complication    Mother- nausea  . GERD (gastroesophageal reflux disease)   . Headache(784.0)    Miagrains- now only once a year. Has light migranies from Dystonia  . Hypertension   . Pneumonia    2011  . Shortness of breath    with extertion   . Wears hearing aid    right    Past Surgical History:  Procedure Laterality Date  . ABDOMINAL HYSTERECTOMY    . CERVICAL FUSION     4, 5, 6  . EXCISION OF TONGUE LESION Right 06/28/2014   Procedure: WIDE LOCAL EXCISION OF THE RIGHT LATERAL TONGUE MASS;  Surgeon: Jerrell Belfast, MD;  Location: [facility];  Service: ENT;  Laterality: Right;    Allergies  Allergen Reactions  . Morphine And Related Itching and Rash  . Sulfa Antibiotics Itching and Rash    Current Outpatient Medications  Medication Sig Dispense Refill  . alendronate (FOSAMAX) 70 MG tablet Take 70 mg by mouth once a week.    Marland Kitchen amLODipine (NORVASC) 5 MG tablet Take 1/2-1 tablet daily per Dr Evette Georges recommendatoins. 90 tablet 3  . atorvastatin (LIPITOR) 20 MG tablet Take 1 tablet (20 mg total) by mouth daily. 90 tablet 3  . baclofen (LIORESAL) 10 MG tablet Take 20 mg by mouth 3 (three) times daily.    . botulinum toxin Type A (BOTOX) 100 units SOLR injection Inject into the skin.    . calcium carbonate (OS-CAL) 600 MG TABS tablet Take 1,200 mg by mouth daily.    . Calcium-Magnesium-Vitamin D (CALCIUM 1200+D3 PO) Take 1 tablet by mouth daily.    . Cholecalciferol (VITAMIN D-3) 5000 UNITS TABS Take 5,000 Units by mouth daily.    . clonazePAM (KLONOPIN) 0.5 MG tablet Take 1.5 mg by mouth 3 (three) times daily.    Marland Kitchen ezetimibe (ZETIA) 10 MG tablet Take 1 tablet (10 mg total) by mouth daily. 90 tablet 3  . gabapentin (NEURONTIN) 300 MG capsule Take 300-900 mg by mouth 3 (three) times daily. 600 mg in the morning, 300 mg at lunch and 1200 mg at night    . meclizine (ANTIVERT) 25 MG tablet  Take 25 mg by mouth daily. Scheduled 1 time a day.    . meloxicam (MOBIC) 15 MG tablet Take 15 mg by mouth daily.    . Multiple Vitamin (MULTIVITAMIN WITH MINERALS) TABS tablet Take 1 tablet by mouth daily.    . Multiple Vitamins-Minerals (HAIR/SKIN/NAILS PO) Take 3 tablets by mouth daily.    . Omega-3 Fatty Acids (FISH OIL) 1200 MG CAPS Take 1,200 mg by mouth daily.    Marland Kitchen omeprazole (PRILOSEC) 20 MG capsule Take 20 mg by mouth daily.    . OnabotulinumtoxinA (BOTOX IJ) Inject as directed every 3 (three) months.    . pilocarpine (SALAGEN) 5 MG tablet Take 5 mg by mouth 3 (three) times daily.  12  .  polyethylene glycol (MIRALAX / GLYCOLAX) packet Take 17 g by mouth as needed.    . Pyridoxine HCl (VITAMIN B-6 PO) Take 1 tablet by mouth daily.    Marland Kitchen rOPINIRole (REQUIP) 0.25 MG tablet Take 1 mg by mouth at bedtime.    . sertraline (ZOLOFT) 100 MG tablet Take 150 mg by mouth daily.    Marland Kitchen tiZANidine (ZANAFLEX) 4 MG tablet Take 1 tablet by mouth at bedtime.    . vitamin E 1000 UNIT capsule Take 1,000 Units by mouth daily.    . metoprolol succinate (TOPROL XL) 25 MG 24 hr tablet Take 1 tablet (25 mg total) by mouth daily. 90 tablet 3   No current facility-administered medications for this visit.     Social History   Socioeconomic History  . Marital status: Divorced    Spouse name: Not on file  . Number of children: Not on file  . Years of education: Not on file  . Highest education level: Not on file  Occupational History  . Not on file  Social Needs  . Financial resource strain: Not on file  . Food insecurity:    Worry: Not on file    Inability: Not on file  . Transportation needs:    Medical: Not on file    Non-medical: Not on file  Tobacco Use  . Smoking status: Former Smoker    Years: 35.00  . Smokeless tobacco: Never Used  Substance and Sexual Activity  . Alcohol use: No  . Drug use: No  . Sexual activity: Yes    Birth control/protection: Patch    Comment: quit smoking10 /2014    Lifestyle  . Physical activity:    Days per week: Not on file    Minutes per session: Not on file  . Stress: Not on file  Relationships  . Social connections:    Talks on phone: Not on file    Gets together: Not on file    Attends religious service: Not on file    Active member of club or organization: Not on file    Attends meetings of clubs or organizations: Not on file    Relationship status: Not on file  . Intimate partner violence:    Fear of current or ex partner: Not on file    Emotionally abused: Not on file    Physically abused: Not on file    Forced sexual activity: Not on file  Other Topics Concern  . Not on file  Social History Narrative  . Not on file   Additional social history is notable and that she is divorced for many years.  She has one child one grandchild. She lives with her mother and brother. She has been on disability. She has been smoking intermittently for 20-30 years.  She does not drink alcohol.  She does not routinely exercise.   Family history is notable that her mother is living at age 77 and has diabetes.  Father died at age 55 and had dementia, Parkinson's disease and COPD.  Her brother is 15 and has a neuropathy.  Her child is 5.  ROS General: Negative; No fevers, chills, or night sweats HEENT:  Positive for facial pain; No changes in vision or hearing, sinus congestion, difficulty swallowing Pulmonary: Negative; No cough, wheezing, shortness of breath, hemoptysis Cardiovascular:  See HPI;  GI:  Positive for GERD GU: Negative; No dysuria, hematuria, or difficulty voiding Musculoskeletal:  Positive for cervical disc disease and arthritis. Hematologic/Oncologic: Negative; no easy bruising,  bleeding Endocrine: Negative; no heat/cold intolerance; no diabetes Neuro: Negative; no changes in balance, headaches Skin: Negative; No rashes or skin lesions Psychiatric:  Positive for anxiety Sleep: Negative; No daytime sleepiness, hypersomnolence,  bruxism, restless legs, hypnogagnic hallucinations Other comprehensive 14 point system review is negative   Physical Exam BP 124/86   Pulse 69   Ht 5' 3"  (1.6 m)   Wt 189 lb 6.4 oz (85.9 kg)   BMI 33.55 kg/m    Repeat blood pressure by me was 136/84  Wt Readings from Last 3 Encounters:  09/06/18 189 lb 6.4 oz (85.9 kg)  08/25/17 187 lb (84.8 kg)  08/19/17 186 lb 9.6 oz (84.6 kg)   General: Alert, oriented, no distress.  Skin: normal turgor, no rashes, warm and dry HEENT: Normocephalic, atraumatic. Pupils equal round and reactive to light; sclera anicteric; extraocular muscles intact;  Nose without nasal septal hypertrophy Mouth/Parynx benign; Mallinpatti scale 3 Neck: No JVD, no carotid bruits; normal carotid upstroke Lungs: clear to ausculatation and percussion; no wheezing or rales Chest wall: without tenderness to palpitation Heart: PMI not displaced, RRR, s1 s2 normal, 1/6 systolic murmur, no diastolic murmur, no rubs, gallops, thrills, or heaves Abdomen: soft, nontender; no hepatosplenomehaly, BS+; abdominal aorta nontender and not dilated by palpation. Back: no CVA tenderness Pulses 2+ Musculoskeletal: full range of motion, normal strength, no joint deformities Extremities: no clubbing cyanosis or edema, Homan's sign negative  Neurologic: grossly nonfocal; Cranial nerves grossly wnl Psychologic: Normal mood and affect   ECG (independently read by me): Normal sinus rhythm at 69 bpm.  No ST segment changes.  No ectopy.  Normal intervals.  November 2018 ECG (independently read by me): Normal sinus rhythm at 72 bpm.  Normal intervals.  No ST segment changes.  October 2017 ECG (independently read by me): Normal sinus rhythm at 65 bpm.  No ectopy.  Normal intervals.  12/17/2015 ECG (independently read by me):  Normal sinus rhythm at 81 bpm.  Normal intervals.  No ST segment changes.  LABS: I recently reviewed the lab work from Dr. Delena Bali from 08/22/2017.  Laboratory  from June 22, 2018 showed a total cholesterol 120, HDL 43, LDL 51, triglycerides 130.  Creatinine was 0.74.  Hemoglobin 13.3.  Potassium 3.8.  BMP Latest Ref Rng & Units 01/08/2016 06/27/2014  Glucose 65 - 99 mg/dL 74 92  BUN 7 - 25 mg/dL 13 11  Creatinine 0.50 - 0.99 mg/dL 0.64 0.65  Sodium 135 - 146 mmol/L 139 143  Potassium 3.5 - 5.3 mmol/L 3.9 4.2  Chloride 98 - 110 mmol/L 99 103  CO2 20 - 31 mmol/L 30 29  Calcium 8.6 - 10.4 mg/dL 9.5 9.8    Hepatic Function Latest Ref Rng & Units 04/05/2018 11/10/2017 01/08/2016  Total Protein 6.0 - 8.5 g/dL 6.6 6.5 6.6  Albumin 3.5 - 4.8 g/dL 4.5 4.4 4.5  AST 0 - 40 IU/L 25 34 24  ALT 0 - 32 IU/L 26 37(H) 29  Alk Phosphatase 39 - 117 IU/L 75 76 63  Total Bilirubin 0.0 - 1.2 mg/dL 0.4 0.4 0.7  Bilirubin, Direct 0.00 - 0.40 mg/dL 0.12 0.10 -    CBC Latest Ref Rng & Units 01/08/2016 06/27/2014  WBC 3.8 - 10.8 K/uL 5.6 6.3  Hemoglobin 11.7 - 15.5 g/dL 14.7 13.9  Hematocrit 35.0 - 45.0 % 43.5 40.9  Platelets 140 - 400 K/uL 173 138(L)   Lab Results  Component Value Date   MCV 91.4 01/08/2016   MCV 89.5 06/27/2014  Lab Results  Component Value Date   TSH 0.84 01/08/2016   No results found for: HGBA1C   BNP No results found for: BNP  ProBNP No results found for: PROBNP   Lipid Panel     Component Value Date/Time   CHOL 123 04/05/2018 1229   TRIG 124 04/05/2018 1229   HDL 46 04/05/2018 1229   CHOLHDL 2.7 04/05/2018 1229   CHOLHDL 3.2 01/08/2016 1102   VLDL 37 (H) 01/08/2016 1102   LDLCALC 52 04/05/2018 1229    RADIOLOGY: No results found.  IMPRESSION:  1. Essential hypertension   2. Exertional dyspnea   3. Diastolic dysfunction without heart failure   4. Hyperlipidemia, unspecified hyperlipidemia type   5. Mild obesity     ASSESSMENT AND PLAN:  Ms. Bethany Fuller is a 72 year old female who has a history of hypertension and hyperlipidemia.  Remotely she had been on felodipine for blood pressure control, but  ultimately was switched to amlodipine.  Her blood pressure today is fairly stable on amlodipine 5 mg daily.  Mostly she had stage II hypertension off blood pressure medications.  An echo Doppler study she was found to have normal systolic function with grade 2 diastolic dysfunction and aortic valve sclerosis without stenosis.  When I saw her last year she had mild LFT elevation and as result I reduced atorvastatin down to 20 mg for hyperlipidemia.  Recent LDL cholesterol remains excellent at 51.  LFTs are normal.  I have suggested the addition of low-dose metoprolol succinate 25 mg daily which would be helpful both for blood pressure as well as her rapid increase in pulse with activity.  I am scheduling her for 2D echo Doppler study to assess systolic and diastolic function.  I discussed the importance of weight loss.  She is obese with a BMI of 33.55.  I discussed the importance of exercise with guideline recommended at least 150 minutes/week corresponding to 5 days/week for at least 30 minutes of moderate intensity.  She continues to take Botox injections for her dystonia.  I will see her in 4 months for reevaluation.  Time spent: 25 minutes  Troy Sine, MD, [facility] 09/07/2018 7:00 PM

## 2018-09-06 NOTE — Patient Instructions (Signed)
Medication Instructions:  START metoprolol succinate (Toprol XL) 25 mg daily  If you need a refill on your cardiac medications before your next appointment, please call your pharmacy.   Testing/Procedures: Your physician has requested that you have an echocardiogram. Echocardiography is a painless test that uses sound waves to create images of your heart. It provides your doctor with information about the size and shape of your heart and how well your heart's chambers and valves are working. This procedure takes approximately one hour. There are no restrictions for this procedure. This [facility] be done at [facility] [address] location:  [facility]  Follow-Up: [facility], you and your health needs are our priority.  As part of our continuing mission to provide you with exceptional heart care, we have created designated Provider Care Teams.  These Care Teams include your primary Cardiologist (physician) and Advanced Practice Providers (APPs -  Physician Assistants and Nurse Practitioners) who all work together to provide you with the care you need, when you need it. You will need a follow up appointment in 4 months.  Please call our office 2 months in advance to schedule this appointment.  You may see Dr. Tresa EndoKelly or one of the following Advanced Practice Providers on your designated Care Team: HighlandHao Meng, [place]PA-C . Micah FlesherAngela Duke, PA-C

## 2018-09-07 ENCOUNTER — Encounter: Payer: Self-pay | Admitting: Cardiovascular Disease

## 2018-09-08 DIAGNOSIS — Z79899 Other long term (current) drug therapy: Secondary | ICD-10-CM | POA: Diagnosis not present

## 2018-09-14 ENCOUNTER — Other Ambulatory Visit: Payer: Self-pay

## 2018-09-14 ENCOUNTER — Ambulatory Visit (HOSPITAL_COMMUNITY): Payer: Medicare Other | Attending: Cardiovascular Disease

## 2018-09-14 DIAGNOSIS — I5189 Other ill-defined heart diseases: Secondary | ICD-10-CM | POA: Diagnosis not present

## 2018-09-14 DIAGNOSIS — I10 Essential (primary) hypertension: Secondary | ICD-10-CM | POA: Diagnosis not present

## 2018-09-20 DIAGNOSIS — Z1231 Encounter for screening mammogram for malignant neoplasm of breast: Secondary | ICD-10-CM | POA: Diagnosis not present

## 2018-09-20 DIAGNOSIS — M85851 Other specified disorders of bone density and structure, right thigh: Secondary | ICD-10-CM | POA: Diagnosis not present

## 2018-09-20 DIAGNOSIS — M85852 Other specified disorders of bone density and structure, left thigh: Secondary | ICD-10-CM | POA: Diagnosis not present

## 2018-09-20 DIAGNOSIS — M8589 Other specified disorders of bone density and structure, multiple sites: Secondary | ICD-10-CM | POA: Diagnosis not present

## 2018-10-07 DIAGNOSIS — B351 Tinea unguium: Secondary | ICD-10-CM | POA: Diagnosis not present

## 2018-10-28 ENCOUNTER — Other Ambulatory Visit: Payer: Self-pay | Admitting: Cardiovascular Disease

## 2018-11-05 DIAGNOSIS — Z79899 Other long term (current) drug therapy: Secondary | ICD-10-CM | POA: Diagnosis not present

## 2018-11-09 ENCOUNTER — Other Ambulatory Visit: Payer: Self-pay

## 2018-11-09 MED ORDER — ATORVASTATIN CALCIUM 20 MG PO TABS: 20.0000 mg | ORAL_TABLET | Freq: Every day | ORAL | 3 refills | Status: DC

## 2018-12-24 DIAGNOSIS — D509 Iron deficiency anemia, unspecified: Secondary | ICD-10-CM | POA: Diagnosis not present

## 2018-12-24 DIAGNOSIS — E559 Vitamin D deficiency, unspecified: Secondary | ICD-10-CM | POA: Diagnosis not present

## 2018-12-24 DIAGNOSIS — I10 Essential (primary) hypertension: Secondary | ICD-10-CM | POA: Diagnosis not present

## 2018-12-24 DIAGNOSIS — E785 Hyperlipidemia, unspecified: Secondary | ICD-10-CM | POA: Diagnosis not present

## 2019-01-24 ENCOUNTER — Telehealth: Payer: Self-pay | Admitting: Cardiovascular Disease

## 2019-01-25 ENCOUNTER — Encounter: Payer: Self-pay | Admitting: Cardiovascular Disease

## 2019-01-25 ENCOUNTER — Telehealth (INDEPENDENT_AMBULATORY_CARE_PROVIDER_SITE_OTHER): Payer: Medicare Other | Admitting: Cardiovascular Disease

## 2019-01-25 VITALS — BP 112/74 | HR 77 | Ht 63.0 in | Wt 187.6 lb

## 2019-01-25 DIAGNOSIS — I10 Essential (primary) hypertension: Secondary | ICD-10-CM

## 2019-01-25 DIAGNOSIS — E669 Obesity, unspecified: Secondary | ICD-10-CM

## 2019-01-25 DIAGNOSIS — E785 Hyperlipidemia, unspecified: Secondary | ICD-10-CM

## 2019-01-25 DIAGNOSIS — I5189 Other ill-defined heart diseases: Secondary | ICD-10-CM

## 2019-01-25 NOTE — Progress Notes (Signed)
.    Virtual Visit via Video Note   This visit type was conducted due to national recommendations for restrictions regarding the COVID-19 Pandemic (e.g. social distancing) in an effort to limit this patient's exposure and mitigate transmission in our community.  Due to her co-morbid illnesses, this patient is at least at moderate risk for complications without adequate follow up.  This format is felt to be most appropriate for this patient at this time.  All issues noted in this document were discussed and addressed.  A limited physical exam was performed with this format.  Please refer to the patient's chart for her consent to telehealth for [facility].   Evaluation Performed:  Follow-up visit  Date:  01/25/2019   ID:  Bethany Fuller, DOB 07/12/1946, MRN [id number]  Patient Location: Home Provider Location: Office  PCP:  Paulina FusiSchultz, Douglas E, MD  Cardiologist:  Nicki Guadalajarahomas Yvonne Stopher, MD Electrophysiologist:  None   Chief Complaint:  4 month F/u  History of Present Illness:    Bethany Fuller is a 73 y.o. female  who was initially referred through the courtesy of Dr. Barney Drainoug Schultz for evaluation of significant  blood pressure lability as well as chest tightness  Bethany Fuller has a long-standing history of hypertension for at least 10 years.  Remotely, she had been on felodipine for blood pressure control  And she states that she ultimately was taken off this therapy and had been off this for the past year. She recently had been evaluated and her blood pressure was 162/107 and then [facility].  Her blood pressure was 152/101. She was recently evaluated I, Dr. Tomasa BlaseSchultz on 12/11/2015 and felodipine was reinstituted. Patient had also complained of some mild headaches and dizziness.  He has a history of tobacco use , family history for CAD, as well as hyperlipidemia. She describes her recent chest pain as a tightness associated with shortness of breath with  mild radiation to her neck and shoulders her symptoms can occur at any time and are not classically exertionally precipitated. She has had significant difficulty with her mouth and had recently seen a neurologist for facial pain.  When I initially saw her, her ECG was unremarkable.  She underwent laboratory which revealed a normal CBC and chemistry profile.  Her lipid studies revealed mild triglyceride elevation at 184 with a total cholesterol 147, LDL cholesterol 64 and HDL cholesterol 46.  I recommended that we discontinue simvastatin 80 mg but for equal potency changed her to atorvastatin 40 mg, since the element 80 mg dose is no longer recommended and with simvastatin.  She may have dose limitations based on concomitant therapy.  I initiated metoprolol 25 mg twice a day and recommended that she undergo an echo Doppler study and Myoview scan.  The echo Doppler study from 01/07/2016 showed an EF of 60-65%, and grade 2 diastolic dysfunction with normal wall motion.  There was aortic valve sclerosis.  Her nuclear study did not reveal any ST segment changes.  An atrial septal and inferolateral defect was noted, but this was felt most likely due to attenuation artifact.  There was no ischemia.  She had normal wall motion with an ejection fraction of 58%.  The study was interpreted as low risk.  When I saw her in March 2017 she had run out of felodipine and I started her on amlodipine.  In  October 2017, she continued to do well on amlodipine 5 mg, and she has been  on atorvastatin 40 mg and over-the-counter fish oil for hyperlipidemia.  She also is on record for restless legs and takes sertraline for anxiety.  At times she also takes meloxicam.  I saw her in November 2018 at which time she remained stable and denied episodes of chest pain, PND orthopnea.  She undergoes Botox treatments for her dystonia   She  underwent a blood work by Dr. Tomasa Blase and SGOT was 64 with an SGPT of 57.  Total cholesterol was 125,  triglycerides 94, HDL 42, and LDL 64.  She presents for reevaluation.   Additional medical problems include history of iron deficiency anemia, seasonal rhinitis, degenerative disease of her cervical vertebral region, osteoarthritis, depression, restless leg syndrome, and urinary incontinence.  When I last saw her in December 2019, she continued to remain fairly stable from a cardiac standpoint.  She again underwent Botox injectionsfor her dystonia.  She admits to episodes of exertional dyspnea.  She denies palpitations but admits that her pulse speeds up fast with activity.  She has been on amlodipine, atorvastatin 20 mg, Zetia 10 mg, in addition to fish oil.  She is on Fosamax for osteoporosis.  During that evaluation I recommended that she undergo a 2D echo Doppler study to assess systolic and diastolic dysfunction.  I suggested the addition of low-dose metoprolol succinate 25 mg daily which would be helpful both for blood pressure as well as her rapid increase in pulse rate with activity.  We discussed improved aerobic capacity with exercise and recommended at least 150 minutes/week of exercise corresponding to 5 days/week for at least 30 minutes of moderate intensity if at all possible.  Her Echo Doppler study was done on September 14, 2018 which showed an EF of 60 to 65% with normal wall motion. There is normal diastolic function and normal valvular architecture.  Since I last saw her, she has continued to feel well.  She has tolerated the addition of Toprol-XL 25 mg.  She notices her resting pulse is slightly proved and her pulse does not go up quite as fast.  Unfortunately, she has not been able to significantly increase her exercise level but she does walk around the house and occasionally outside.  She saw Dr. Manus Rudd and apparently laboratory continued to be stable.  The patient does not have symptoms concerning for COVID-19 infection (fever, chills, cough, or new shortness of breath).    Past  Medical History:  Diagnosis Date   Arthritis    Cervical dystonia    Constipation    Depression    Family history of anesthesia complication    Mother- nausea   GERD (gastroesophageal reflux disease)    Headache(784.0)    Miagrains- now only once a year. Has light migranies from Dystonia   Hypertension    Pneumonia    2011   Shortness of breath    with extertion    Wears hearing aid    right   Past Surgical History:  Procedure Laterality Date   ABDOMINAL HYSTERECTOMY     CERVICAL FUSION     4, 5, 6   EXCISION OF TONGUE LESION Right 06/28/2014   Procedure: WIDE LOCAL EXCISION OF THE RIGHT LATERAL TONGUE MASS;  Surgeon: Osborn Coho, MD;  Location: [facility] OR;  Service: ENT;  Laterality: Right;     Current Meds  Medication Sig   alendronate (FOSAMAX) 70 MG tablet Take 70 mg by mouth once a week.   amLODipine (NORVASC) 5 MG tablet Take 1/2-1 tablet daily per  Dr Landry Dyke recommendatoins.   atorvastatin (LIPITOR) 20 MG tablet Take 1 tablet (20 mg total) by mouth daily.   baclofen (LIORESAL) 10 MG tablet Take 20 mg by mouth 3 (three) times daily.   botulinum toxin Type A (BOTOX) 100 units SOLR injection Inject into the skin.   calcium carbonate (OS-CAL) 600 MG TABS tablet Take 1,200 mg by mouth daily.   Calcium-Magnesium-Vitamin D (CALCIUM 1200+D3 PO) Take 1 tablet by mouth daily.   Cholecalciferol (VITAMIN D-3) 5000 UNITS TABS Take 5,000 Units by mouth daily.   clonazePAM (KLONOPIN) 0.5 MG tablet Take 1.5 mg by mouth 3 (three) times daily.   ezetimibe (ZETIA) 10 MG tablet Take 1 tablet (10 mg total) by mouth daily.   gabapentin (NEURONTIN) 300 MG capsule Take 300-900 mg by mouth 3 (three) times daily. 600 mg in the morning, 300 mg at lunch and 1200 mg at night   meclizine (ANTIVERT) 25 MG tablet Take 25 mg by mouth daily. Scheduled 1 time a day.   meloxicam (MOBIC) 15 MG tablet Take 15 mg by mouth daily.   metoprolol succinate (TOPROL XL) 25 MG 24 hr  tablet Take 1 tablet (25 mg total) by mouth daily.   Multiple Vitamin (MULTIVITAMIN WITH MINERALS) TABS tablet Take 1 tablet by mouth daily.   Multiple Vitamins-Minerals (HAIR/SKIN/NAILS PO) Take 3 tablets by mouth daily.   Omega-3 Fatty Acids (FISH OIL) 1200 MG CAPS Take 1,200 mg by mouth daily.   omeprazole (PRILOSEC) 20 MG capsule Take 20 mg by mouth daily.   OnabotulinumtoxinA (BOTOX IJ) Inject as directed every 3 (three) months.   pilocarpine (SALAGEN) 5 MG tablet Take 5 mg by mouth 3 (three) times daily.   polyethylene glycol (MIRALAX / GLYCOLAX) packet Take 17 g by mouth as needed.   Pyridoxine HCl (VITAMIN B-6 PO) Take 1 tablet by mouth daily.   rOPINIRole (REQUIP) 0.25 MG tablet Take 1 mg by mouth at bedtime.   sertraline (ZOLOFT) 100 MG tablet Take 150 mg by mouth daily.   tiZANidine (ZANAFLEX) 4 MG tablet Take 1 tablet by mouth at bedtime.   vitamin E 1000 UNIT capsule Take 1,000 Units by mouth daily.     Allergies:   Morphine and related and Sulfa antibiotics   Social History   Tobacco Use   Smoking status: Former Smoker    Years: 35.00   Smokeless tobacco: Never Used  Substance Use Topics   Alcohol use: No   Drug use: No     Family Hx: The patient's family history is not on file.  ROS:   Please see the history of present illness.    She denies any visual changes or changes in hearing. She denies any significant change in weight She denies any recent fever chills or night sweats or cough There is no wheezing She believes her pulse is improved.  She denies chest tightness.  She denies pulse irregularity She does have cervical disc disease and arthritis No rashes Nose leg swelling There is a history for anxiety She is sleeping well All other systems reviewed and are negative.   Prior CV studies:   The following studies were reviewed today:  ------------------------------------------------------------------- ECHO Study Conclusions:  08/2018  - Left ventricle: The cavity size was normal. Wall thickness was   normal. Systolic function was normal. The estimated ejection   fraction was in the range of 60% to 65%. Wall motion was normal;   there were no regional wall motion abnormalities.  Labs/Other Tests and Data  Reviewed:    EKG:  An ECG dated 09/06/2018 was personally reviewed today and demonstrated:  Normal sinus rhythm at 69 bpm.  No ST segment changes.  No ectopy.  Normal intervals.  Recent Labs: 04/05/2018: ALT 26   Recent Lipid Panel Lab Results  Component Value Date/Time   CHOL 123 04/05/2018 12:29 PM   TRIG 124 04/05/2018 12:29 PM   HDL 46 04/05/2018 12:29 PM   CHOLHDL 2.7 04/05/2018 12:29 PM   CHOLHDL 3.2 01/08/2016 11:02 AM   LDLCALC 52 04/05/2018 12:29 PM    Wt Readings from Last 3 Encounters:  01/25/19 187 lb 9.6 oz (85.1 kg)  09/06/18 189 lb 6.4 oz (85.9 kg)  08/25/17 187 lb (84.8 kg)     Objective:    Vital Signs:  BP 112/74    Pulse 77    Ht  (1.6 m)    Wt 187 lb 9.6 oz (85.1 kg)    BMI 33.23 kg/m    She was well-developed and well-nourished in no acute distress. Mild obesity HEENT was unremarkable There did not appear to be any JVD Breathing was normal.  There was no audible wheezing She denied any chest pain or pressure to palpation There was no abdominal discomfort She denied any edema or myalgias No rash Neurologically she appeared grossly normal She had normal affect and mood  ASSESSMENT & PLAN:    1. Essential hypertension: Her blood pressure today is improved on her medical regimen now consisting of amlodipine 5 mg in addition to Toprol-XL 25 mg 2. Hyperlipidemia: She continues to be on atorvastatin 20 mg and Zetia 10 mg.  LDL cholesterol in July 2019 was excellent at 52.  She tells me she recently underwent laboratory by Dr. Tomasa Blase and she was told her labs were excellent 3. Mild obesity: I again discussed the importance of weight loss and increased exercise to improve  both aerobic capacity and burn calories.  BMI is 33.  We have set a goal to reduce her BMI to less than 30 if at all possible. 4. History of diastolic dysfunction: She was previously noted to have stage II diastolic dysfunction on prior echoes.  Her most recent echo Doppler study done in December 2019 showed normal systolic function.  There was no mention made of diastolic dysfunction.  Valvular architecture was normal. 5. Anxiety: She continues to be on Klonopin and Zoloft  COVID-19 Education: The signs and symptoms of COVID-19 were discussed with the patient and how to seek care for testing (follow up with PCP or arrange E-visit).  The importance of social distancing was discussed today.  Time:   Today, I have spent 25 minutes with the patient with telehealth technology discussing the above problems.     Medication Adjustments/Labs and Tests Ordered: Current medicines are reviewed at length with the patient today.  Concerns regarding medicines are outlined above.   Tests Ordered: No orders of the defined types were placed in this encounter.   Medication Changes: No orders of the defined types were placed in this encounter.   Disposition:  Follow up 1 year  Signed, Nicki Guadalajara, MD  01/25/2019 1:33 PM    [facility]

## 2019-01-25 NOTE — Patient Instructions (Addendum)

## 2019-02-03 DIAGNOSIS — Z1211 Encounter for screening for malignant neoplasm of colon: Secondary | ICD-10-CM | POA: Diagnosis not present

## 2019-02-03 DIAGNOSIS — Z0000 Encounter for general adult medical examination without abnormal findings: Secondary | ICD-10-CM | POA: Diagnosis not present

## 2019-02-03 DIAGNOSIS — Z1231 Encounter for screening mammogram for malignant neoplasm of breast: Secondary | ICD-10-CM | POA: Diagnosis not present

## 2019-02-03 DIAGNOSIS — E785 Hyperlipidemia, unspecified: Secondary | ICD-10-CM | POA: Diagnosis not present

## 2019-02-28 DIAGNOSIS — G2581 Restless legs syndrome: Secondary | ICD-10-CM | POA: Diagnosis not present

## 2019-02-28 DIAGNOSIS — Z882 Allergy status to sulfonamides status: Secondary | ICD-10-CM | POA: Diagnosis not present

## 2019-02-28 DIAGNOSIS — G243 Spasmodic torticollis: Secondary | ICD-10-CM | POA: Diagnosis not present

## 2019-02-28 DIAGNOSIS — F17210 Nicotine dependence, cigarettes, uncomplicated: Secondary | ICD-10-CM | POA: Diagnosis not present

## 2019-02-28 DIAGNOSIS — R51 Headache: Secondary | ICD-10-CM | POA: Diagnosis not present

## 2019-02-28 DIAGNOSIS — Z981 Arthrodesis status: Secondary | ICD-10-CM | POA: Diagnosis not present

## 2019-02-28 DIAGNOSIS — E785 Hyperlipidemia, unspecified: Secondary | ICD-10-CM | POA: Diagnosis not present

## 2019-02-28 DIAGNOSIS — Z79899 Other long term (current) drug therapy: Secondary | ICD-10-CM | POA: Diagnosis not present

## 2019-02-28 DIAGNOSIS — Z885 Allergy status to narcotic agent status: Secondary | ICD-10-CM | POA: Diagnosis not present

## 2019-02-28 DIAGNOSIS — I10 Essential (primary) hypertension: Secondary | ICD-10-CM | POA: Diagnosis not present

## 2019-03-28 NOTE — Telephone Encounter (Signed)
Opened in error

## 2019-04-20 DIAGNOSIS — R1011 Right upper quadrant pain: Secondary | ICD-10-CM | POA: Diagnosis not present

## 2019-04-20 DIAGNOSIS — R1310 Dysphagia, unspecified: Secondary | ICD-10-CM | POA: Diagnosis not present

## 2019-04-21 ENCOUNTER — Other Ambulatory Visit: Payer: Self-pay | Admitting: *Deleted

## 2019-04-21 MED ORDER — AMLODIPINE BESYLATE 5 MG PO TABS: ORAL_TABLET | ORAL | 3 refills | Status: DC

## 2019-04-27 ENCOUNTER — Telehealth: Payer: Self-pay | Admitting: Cardiovascular Disease

## 2019-04-27 NOTE — Telephone Encounter (Signed)
Called pt and left message informing pt that I called her pharmacy [facility] and they stated that the pt's medication Atorvastatin, Zetia and Gabapentin was all sent out to pt on July14, 2020 and received by someone at her address on April 18, 2019 at 11:55 am and if pt does not have medication, that she needed to call her pharmacy to inquire about this matter.

## 2019-05-03 DIAGNOSIS — K648 Other hemorrhoids: Secondary | ICD-10-CM | POA: Diagnosis not present

## 2019-05-03 DIAGNOSIS — K635 Polyp of colon: Secondary | ICD-10-CM | POA: Diagnosis not present

## 2019-05-03 DIAGNOSIS — G243 Spasmodic torticollis: Secondary | ICD-10-CM | POA: Diagnosis not present

## 2019-05-03 DIAGNOSIS — Z86010 Personal history of colonic polyps: Secondary | ICD-10-CM | POA: Diagnosis not present

## 2019-05-03 DIAGNOSIS — I10 Essential (primary) hypertension: Secondary | ICD-10-CM | POA: Diagnosis not present

## 2019-05-03 DIAGNOSIS — Z90710 Acquired absence of both cervix and uterus: Secondary | ICD-10-CM | POA: Diagnosis not present

## 2019-05-03 DIAGNOSIS — Z79899 Other long term (current) drug therapy: Secondary | ICD-10-CM | POA: Diagnosis not present

## 2019-06-27 DIAGNOSIS — H9071 Mixed conductive and sensorineural hearing loss, unilateral, right ear, with unrestricted hearing on the contralateral side: Secondary | ICD-10-CM | POA: Diagnosis not present

## 2019-06-27 DIAGNOSIS — E785 Hyperlipidemia, unspecified: Secondary | ICD-10-CM | POA: Diagnosis not present

## 2019-06-27 DIAGNOSIS — D509 Iron deficiency anemia, unspecified: Secondary | ICD-10-CM | POA: Diagnosis not present

## 2019-06-27 DIAGNOSIS — N390 Urinary tract infection, site not specified: Secondary | ICD-10-CM | POA: Diagnosis not present

## 2019-06-27 DIAGNOSIS — I10 Essential (primary) hypertension: Secondary | ICD-10-CM | POA: Diagnosis not present

## 2019-06-27 DIAGNOSIS — E559 Vitamin D deficiency, unspecified: Secondary | ICD-10-CM | POA: Diagnosis not present

## 2019-08-21 ENCOUNTER — Other Ambulatory Visit: Payer: Self-pay | Admitting: Cardiovascular Disease

## 2019-08-22 DIAGNOSIS — F329 Major depressive disorder, single episode, unspecified: Secondary | ICD-10-CM | POA: Diagnosis not present

## 2019-08-22 DIAGNOSIS — Z79899 Other long term (current) drug therapy: Secondary | ICD-10-CM | POA: Diagnosis not present

## 2019-08-22 DIAGNOSIS — I10 Essential (primary) hypertension: Secondary | ICD-10-CM | POA: Diagnosis not present

## 2019-08-22 DIAGNOSIS — Z885 Allergy status to narcotic agent status: Secondary | ICD-10-CM | POA: Diagnosis not present

## 2019-08-22 DIAGNOSIS — Z981 Arthrodesis status: Secondary | ICD-10-CM | POA: Diagnosis not present

## 2019-08-22 DIAGNOSIS — Z882 Allergy status to sulfonamides status: Secondary | ICD-10-CM | POA: Diagnosis not present

## 2019-08-22 DIAGNOSIS — F17210 Nicotine dependence, cigarettes, uncomplicated: Secondary | ICD-10-CM | POA: Diagnosis not present

## 2019-08-22 DIAGNOSIS — E785 Hyperlipidemia, unspecified: Secondary | ICD-10-CM | POA: Diagnosis not present

## 2019-08-22 DIAGNOSIS — G243 Spasmodic torticollis: Secondary | ICD-10-CM | POA: Diagnosis not present

## 2019-08-29 ENCOUNTER — Other Ambulatory Visit: Payer: Self-pay

## 2019-08-29 MED ORDER — METOPROLOL SUCCINATE ER 25 MG PO TB24: 25.0000 mg | ORAL_TABLET | Freq: Every day | ORAL | 3 refills | Status: DC

## 2019-08-29 NOTE — Telephone Encounter (Signed)
Patient called to request that Metoprolol needs to be sent to [facility]. RX sent.

## 2019-10-03 ENCOUNTER — Other Ambulatory Visit: Payer: Self-pay

## 2019-10-03 MED ORDER — ATORVASTATIN CALCIUM 20 MG PO TABS: 20.0000 mg | ORAL_TABLET | Freq: Every day | ORAL | 3 refills | Status: DC

## 2019-10-24 DIAGNOSIS — E785 Hyperlipidemia, unspecified: Secondary | ICD-10-CM | POA: Diagnosis not present

## 2019-10-24 DIAGNOSIS — Z981 Arthrodesis status: Secondary | ICD-10-CM | POA: Diagnosis not present

## 2019-10-24 DIAGNOSIS — Z882 Allergy status to sulfonamides status: Secondary | ICD-10-CM | POA: Diagnosis not present

## 2019-10-24 DIAGNOSIS — Z885 Allergy status to narcotic agent status: Secondary | ICD-10-CM | POA: Diagnosis not present

## 2019-10-24 DIAGNOSIS — I10 Essential (primary) hypertension: Secondary | ICD-10-CM | POA: Diagnosis not present

## 2019-10-24 DIAGNOSIS — Z87891 Personal history of nicotine dependence: Secondary | ICD-10-CM | POA: Diagnosis not present

## 2019-10-24 DIAGNOSIS — G243 Spasmodic torticollis: Secondary | ICD-10-CM | POA: Diagnosis not present

## 2019-10-24 DIAGNOSIS — Z79899 Other long term (current) drug therapy: Secondary | ICD-10-CM | POA: Diagnosis not present

## 2019-10-24 DIAGNOSIS — F329 Major depressive disorder, single episode, unspecified: Secondary | ICD-10-CM | POA: Diagnosis not present

## 2019-10-31 ENCOUNTER — Other Ambulatory Visit: Payer: Self-pay | Admitting: Cardiovascular Disease

## 2019-10-31 MED ORDER — EZETIMIBE 10 MG PO TABS: 10.0000 mg | ORAL_TABLET | Freq: Every day | ORAL | 0 refills | Status: DC

## 2019-11-03 ENCOUNTER — Other Ambulatory Visit: Payer: Self-pay | Admitting: Cardiovascular Disease

## 2019-11-03 NOTE — Telephone Encounter (Signed)
*  STAT* If patient is at the pharmacy, call can be transferred to refill team.   1. Which medications need to be refilled? (please list name of each medication and dose if known) ezetimibe (ZETIA) 10 MG tablet  2. Which pharmacy/location (including street and city if local pharmacy) is medication to be sent to? [facility] - [place], [place] - [address]  3. Do they need a 30 day or 90 day supply? 90

## 2019-11-22 DIAGNOSIS — Z1231 Encounter for screening mammogram for malignant neoplasm of breast: Secondary | ICD-10-CM | POA: Diagnosis not present

## 2019-12-01 ENCOUNTER — Other Ambulatory Visit: Payer: Self-pay | Admitting: Cardiovascular Disease

## 2019-12-01 NOTE — Telephone Encounter (Signed)
*  STAT* If patient is at the pharmacy, call can be transferred to refill team.   1. Which medications need to be refilled? (please list name of each medication and dose if known) ezetimibe (ZETIA) 10 MG tablet  2. Which pharmacy/location (including street and city if local pharmacy) is medication to be sent to? [facility] - [place], [place] - [address]  3. Do they need a 30 day or 90 day supply? 90  Patient has appt with Dr. Tresa Endo on 01/31/20

## 2019-12-06 MED ORDER — EZETIMIBE 10 MG PO TABS: 10.0000 mg | ORAL_TABLET | Freq: Every day | ORAL | 0 refills | Status: DC

## 2019-12-26 DIAGNOSIS — E559 Vitamin D deficiency, unspecified: Secondary | ICD-10-CM | POA: Diagnosis not present

## 2019-12-26 DIAGNOSIS — I10 Essential (primary) hypertension: Secondary | ICD-10-CM | POA: Diagnosis not present

## 2019-12-26 DIAGNOSIS — D509 Iron deficiency anemia, unspecified: Secondary | ICD-10-CM | POA: Diagnosis not present

## 2019-12-26 DIAGNOSIS — E785 Hyperlipidemia, unspecified: Secondary | ICD-10-CM | POA: Diagnosis not present

## 2020-01-31 ENCOUNTER — Ambulatory Visit: Payer: Medicare Other | Admitting: Cardiovascular Disease

## 2020-02-06 DIAGNOSIS — Z981 Arthrodesis status: Secondary | ICD-10-CM | POA: Diagnosis not present

## 2020-02-06 DIAGNOSIS — F419 Anxiety disorder, unspecified: Secondary | ICD-10-CM | POA: Diagnosis not present

## 2020-02-06 DIAGNOSIS — Z882 Allergy status to sulfonamides status: Secondary | ICD-10-CM | POA: Diagnosis not present

## 2020-02-06 DIAGNOSIS — Z885 Allergy status to narcotic agent status: Secondary | ICD-10-CM | POA: Diagnosis not present

## 2020-02-06 DIAGNOSIS — G243 Spasmodic torticollis: Secondary | ICD-10-CM | POA: Diagnosis not present

## 2020-02-06 DIAGNOSIS — I10 Essential (primary) hypertension: Secondary | ICD-10-CM | POA: Diagnosis not present

## 2020-02-06 DIAGNOSIS — H9190 Unspecified hearing loss, unspecified ear: Secondary | ICD-10-CM | POA: Diagnosis not present

## 2020-02-06 DIAGNOSIS — E785 Hyperlipidemia, unspecified: Secondary | ICD-10-CM | POA: Diagnosis not present

## 2020-02-06 DIAGNOSIS — F329 Major depressive disorder, single episode, unspecified: Secondary | ICD-10-CM | POA: Diagnosis not present

## 2020-02-06 DIAGNOSIS — F17210 Nicotine dependence, cigarettes, uncomplicated: Secondary | ICD-10-CM | POA: Diagnosis not present

## 2020-02-08 ENCOUNTER — Ambulatory Visit: Payer: Medicare Other | Admitting: Physician Assistant

## 2020-02-13 DIAGNOSIS — Z1331 Encounter for screening for depression: Secondary | ICD-10-CM | POA: Diagnosis not present

## 2020-02-13 DIAGNOSIS — Z0000 Encounter for general adult medical examination without abnormal findings: Secondary | ICD-10-CM | POA: Diagnosis not present

## 2020-02-13 DIAGNOSIS — Z6835 Body mass index (BMI) 35.0-35.9, adult: Secondary | ICD-10-CM | POA: Diagnosis not present

## 2020-02-13 DIAGNOSIS — Z9181 History of falling: Secondary | ICD-10-CM | POA: Diagnosis not present

## 2020-03-14 DIAGNOSIS — M1712 Unilateral primary osteoarthritis, left knee: Secondary | ICD-10-CM | POA: Diagnosis not present

## 2020-03-14 DIAGNOSIS — F17200 Nicotine dependence, unspecified, uncomplicated: Secondary | ICD-10-CM | POA: Diagnosis not present

## 2020-03-14 DIAGNOSIS — M25562 Pain in left knee: Secondary | ICD-10-CM | POA: Diagnosis not present

## 2020-04-12 ENCOUNTER — Other Ambulatory Visit: Payer: Self-pay

## 2020-04-12 MED ORDER — AMLODIPINE BESYLATE 5 MG PO TABS: ORAL_TABLET | ORAL | 0 refills | Status: DC

## 2020-04-12 NOTE — Telephone Encounter (Signed)
This is Dr. Kelly's pt. °

## 2020-05-08 ENCOUNTER — Ambulatory Visit: Payer: Medicare Other | Admitting: Cardiovascular Disease

## 2020-05-08 ENCOUNTER — Encounter: Payer: Self-pay | Admitting: Cardiovascular Disease

## 2020-05-08 ENCOUNTER — Other Ambulatory Visit: Payer: Self-pay

## 2020-05-08 DIAGNOSIS — E669 Obesity, unspecified: Secondary | ICD-10-CM

## 2020-05-08 DIAGNOSIS — E785 Hyperlipidemia, unspecified: Secondary | ICD-10-CM

## 2020-05-08 DIAGNOSIS — I951 Orthostatic hypotension: Secondary | ICD-10-CM | POA: Diagnosis not present

## 2020-05-08 DIAGNOSIS — G243 Spasmodic torticollis: Secondary | ICD-10-CM

## 2020-05-08 DIAGNOSIS — I10 Essential (primary) hypertension: Secondary | ICD-10-CM

## 2020-05-08 MED ORDER — AMLODIPINE BESYLATE 2.5 MG PO TABS: 2.5000 mg | ORAL_TABLET | Freq: Every day | ORAL | 3 refills | Status: DC

## 2020-05-08 NOTE — Patient Instructions (Signed)
Medication Instructions:  DECREASE YOUR AMLODIPINE TO 2.5MG   *If you need a refill on your cardiac medications before your next appointment, please call your pharmacy*    Follow-Up: At [facility], you and your health needs are our priority.  As part of our continuing mission to provide you with exceptional heart care, we have created designated Provider Care Teams.  These Care Teams include your primary Cardiologist (physician) and Advanced Practice Providers (APPs -  Physician Assistants and Nurse Practitioners) who all work together to provide you with the care you need, when you need it.  We recommend signing up for the patient portal called "MyChart".  Sign up information is provided on this After Visit Summary.  MyChart is used to connect with patients for Virtual Visits (Telemedicine).  Patients are able to view lab/test results, encounter notes, upcoming appointments, etc.  Non-urgent messages can be sent to your provider as well.   To learn more about what you can do with MyChart, go to [email or web address].    Your next appointment:   6 month(s)  The format for your next appointment:   In Person  Provider:   Nicki Guadalajara, MD   Other Instructions COMPRESSION STOCKINGS

## 2020-05-08 NOTE — Progress Notes (Signed)
Patient ID: Bethany Fuller, female   DOB: [birth year], 74 y.o.   MRN: [id number]     Primary MD:  Dr. Nelda Bucks  PATIENT PROFILE: Bethany Fuller is a 74 y.o. female who was initially referred through the courtesy of Dr. Ilda Fuller for evaluation of significant  blood pressure lability as well as chest tightness.  She presents for a 56-monthfollow-up cardiology evaluation.  HPI:  Bethany Altmannhas a long-standing history of hypertension for at least 10 years.  Remotely, she had been on felodipine for blood pressure control  And she states that she ultimately was taken off this therapy and had been off this for the past year. She recently had been evaluated and her blood pressure was 162/107 and then [facility]  Her blood pressure was 152/101. She was recently evaluated I, Dr. SDelena Fuller 12/11/2015 and felodipine was reinstituted. Patient had also complained of some mild headaches and dizziness.  He has a history of tobacco use , family history for CAD, as well as hyperlipidemia. She describes her recent chest pain as a tightness associated with shortness of breath with mild radiation to her neck and shoulders her symptoms can occur at any time and are not classically exertionally precipitated. She has had significant difficulty with her mouth and had recently seen a neurologist for facial pain.  When I initially saw her, her ECG was unremarkable.  She underwent laboratory which revealed a normal CBC and chemistry profile.  Her lipid studies revealed mild triglyceride elevation at 184 with a total cholesterol 147, LDL cholesterol 64 and HDL cholesterol 46.  I recommended that we discontinue simvastatin 80 mg but for equal potency changed her to atorvastatin 40 mg, since the element 80 mg dose is no longer recommended and with simvastatin.  She may have dose limitations based on concomitant therapy.  I initiated metoprolol 25 mg twice a day and  recommended that she undergo an echo Doppler study and Myoview scan.  The echo Doppler study from 01/07/2016 showed an EF of 60-65%, and grade 2 diastolic dysfunction with normal wall motion.  There was aortic valve sclerosis.  Her nuclear study did not reveal any ST segment changes.  An atrial septal and inferolateral defect was noted, but this was felt most likely due to attenuation artifact.  There was no ischemia.  She had normal wall motion with an ejection fraction of 58%.  The study was interpreted as low risk.  When I saw her in March 2017 she had run out of felodipine and I started her on amlodipine.  In  October 2017, she continued to do well on amlodipine 5 mg, and she has been on atorvastatin 40 mg and over-the-counter fish oil for hyperlipidemia.  She also is on record for restless legs and takes sertraline for anxiety.  At times she also takes meloxicam.  I saw her in November 2018 at which time she remained stable and denied episodes of chest pain, PND orthopnea.  She undergoes Botox treatments for her dystonia   She  underwent a blood work by Dr. SDelena Baliand Fuller was 64 with an SGPT of 57.  Total cholesterol was 125, triglycerides 94, HDL 42, and LDL 64.  She presents for reevaluation.   Additional medical problems include history of iron deficiency anemia, seasonal rhinitis, degenerative disease of her cervical vertebral region, osteoarthritis, depression, restless leg syndrome, and urinary incontinence.  When I  saw her in December 2019, she continued  to remain fairly stable from a cardiac standpoint. She again underwent Botox injections for her dystonia. She admits to episodes of exertional dyspnea. She denies palpitations but admits that her pulse speeds up fast with activity. She has been on amlodipine, atorvastatin 20 mg, Zetia 10 mg, in addition to fish oil. She is on Fosamax for osteoporosis.  During that evaluation I recommended that she undergo a 2D echo Doppler study to  assess systolic and diastolic dysfunction.  I suggested the addition of low-dose metoprolol succinate 25 mg daily which would be helpful both for blood pressure as well as her rapid increase in pulse rate with activity.  We discussed improved aerobic capacity with exercise and recommended at least 150 minutes/week of exercise corresponding to 5 days/week for at least 30 minutes of moderate intensity if at all possible.  An Echo Doppler study on September 14, 2018 showed an EF of 60 to 65% with normal wall motion. There is normal diastolic function and normal valvular architecture.  She was last evaluated by me in a telemedicine visit on January 25, 2019.  At that time she continued to feel well . She has tolerated the addition of Toprol-XL 25 mg.  Her resting pulse was not as fast and did not go up with activity as quickly.  Since her last evaluation in April 2020, she has continued to undergo Botox injections at 59-monthintervals for her cervical dystonia.  She denies any chest pain.  She has been on amlodipine 5 mg as well as metoprolol 25 mg daily.  She continues to be on atorvastatin 20 mg and Zetia 10 mg for hyperlipidemia.  At times he does note some mild dizziness with standing.  She denies chest pain.  She presents for evaluation.  Past Medical History:  Diagnosis Date  . Arthritis   . Cervical dystonia   . Constipation   . Depression   . Family history of anesthesia complication    Mother- nausea  . GERD (gastroesophageal reflux disease)   . Headache(784.0)    Miagrains- now only once a year. Has light migranies from Dystonia  . Hypertension   . Pneumonia    2011  . Shortness of breath    with extertion   . Wears hearing aid    right    Past Surgical History:  Procedure Laterality Date  . ABDOMINAL HYSTERECTOMY    . CERVICAL FUSION     4, 5, 6  . EXCISION OF TONGUE LESION Right 06/28/2014   Procedure: WIDE LOCAL EXCISION OF THE RIGHT LATERAL TONGUE MASS;  Surgeon: DJerrell Belfast MD;  Location: [facility]  Service: ENT;  Laterality: Right;    Allergies  Allergen Reactions  . Morphine And Related Itching and Rash  . Sulfa Antibiotics Itching and Rash    Current Outpatient Medications  Medication Sig Dispense Refill  . alendronate (FOSAMAX) 70 MG tablet Take 70 mg by mouth once a week.    .Marland KitchenamLODipine (NORVASC) 2.5 MG tablet Take 1 tablet (2.5 mg total) by mouth daily. 90 tablet 3  . atorvastatin (LIPITOR) 20 MG tablet Take 1 tablet (20 mg total) by mouth daily. 90 tablet 3  . baclofen (LIORESAL) 10 MG tablet Take 20 mg by mouth 3 (three) times daily.    . botulinum toxin Type A (BOTOX) 100 units SOLR injection Inject into the skin.    . calcium carbonate (OS-CAL) 600 MG TABS tablet Take 1,200 mg by mouth daily.    . Calcium-Magnesium-Vitamin D (CALCIUM  1200+D3 PO) Take 1 tablet by mouth daily.    . Cholecalciferol (VITAMIN D-3) 5000 UNITS TABS Take 5,000 Units by mouth daily.    . clonazePAM (KLONOPIN) 0.5 MG tablet Take 1.5 mg by mouth 3 (three) times daily.    Marland Kitchen ezetimibe (ZETIA) 10 MG tablet Take 1 tablet (10 mg total) by mouth daily. MUST KEEP appt with Dr. Claiborne Billings in April for refills. Thank you 90 tablet 0  . gabapentin (NEURONTIN) 300 MG capsule Take 300-900 mg by mouth 3 (three) times daily. 600 mg in the morning, 300 mg at lunch and 1200 mg at night    . meloxicam (MOBIC) 15 MG tablet Take 15 mg by mouth daily.    . metoprolol succinate (TOPROL-XL) 25 MG 24 hr tablet Take 1 tablet (25 mg total) by mouth daily. 90 tablet 3  . Multiple Vitamin (MULTIVITAMIN WITH MINERALS) TABS tablet Take 1 tablet by mouth daily.    . Multiple Vitamins-Minerals (HAIR/SKIN/NAILS PO) Take 3 tablets by mouth daily.    . Omega-3 Fatty Acids (FISH OIL) 1200 MG CAPS Take 1,200 mg by mouth daily.    Marland Kitchen omeprazole (PRILOSEC) 20 MG capsule Take 20 mg by mouth daily.    . OnabotulinumtoxinA (BOTOX IJ) Inject as directed every 3 (three) months.    . pilocarpine (SALAGEN) 5 MG  tablet Take 5 mg by mouth 3 (three) times daily.  12  . polyethylene glycol (MIRALAX / GLYCOLAX) packet Take 17 g by mouth as needed.    . Pyridoxine HCl (VITAMIN B-6 PO) Take 1 tablet by mouth daily.    Marland Kitchen rOPINIRole (REQUIP) 0.25 MG tablet Take 1 mg by mouth at bedtime.    . sertraline (ZOLOFT) 100 MG tablet Take 150 mg by mouth daily.    Marland Kitchen tiZANidine (ZANAFLEX) 4 MG tablet Take 1 tablet by mouth at bedtime.    . vitamin E 1000 UNIT capsule Take 1,000 Units by mouth daily.     No current facility-administered medications for this visit.    Social History   Socioeconomic History  . Marital status: Divorced    Spouse name: Not on file  . Number of children: Not on file  . Years of education: Not on file  . Highest education level: Not on file  Occupational History  . Not on file  Tobacco Use  . Smoking status: Former Smoker    Years: 35.00  . Smokeless tobacco: Never Used  Substance and Sexual Activity  . Alcohol use: No  . Drug use: No  . Sexual activity: Yes    Birth control/protection: Patch    Comment: quit smoking10 /2014  Other Topics Concern  . Not on file  Social History Narrative  . Not on file   Social Determinants of Health   Financial Resource Strain:   . Difficulty of Paying Living Expenses:   Food Insecurity:   . Worried About Charity fundraiser in the Last Year:   . Arboriculturist in the Last Year:   Transportation Needs:   . Film/video editor (Medical):   Marland Kitchen Lack of Transportation (Non-Medical):   Physical Activity:   . Days of Exercise per Week:   . Minutes of Exercise per Session:   Stress:   . Feeling of Stress :   Social Connections:   . Frequency of Communication with Friends and Family:   . Frequency of Social Gatherings with Friends and Family:   . Attends Religious Services:   . Active Member  of Clubs or Organizations:   . Attends Archivist Meetings:   Marland Kitchen Marital Status:   Intimate Partner Violence:   . Fear of Current  or Ex-Partner:   . Emotionally Abused:   Marland Kitchen Physically Abused:   . Sexually Abused:    Additional social history is notable and that she is divorced for many years.  She has one child one grandchild. She lives with her mother and brother. She has been on disability. She has been smoking intermittently for 20-30 years.  She does not drink alcohol.  She does not routinely exercise.   Family history is notable that her mother is living at age 26 and has diabetes.  Father died at age 83 and had dementia, Parkinson's disease and COPD.  Her brother is 5 and has a neuropathy.  Her child is 79.  ROS General: Negative; No fevers, chills, or night sweats HEENT:  Positive for facial pain; No changes in vision or hearing, sinus congestion, difficulty swallowing Pulmonary: Negative; No cough, wheezing, shortness of breath, hemoptysis Cardiovascular:  See HPI;  GI:  Positive for GERD GU: Negative; No dysuria, hematuria, or difficulty voiding Musculoskeletal:  Positive for cervical disc disease and arthritis. Hematologic/Oncologic: Negative; no easy bruising, bleeding Endocrine: Negative; no heat/cold intolerance; no diabetes Neuro: Negative; no changes in balance, headaches Skin: Negative; No rashes or skin lesions Psychiatric:  Positive for anxiety Sleep: Negative; No daytime sleepiness, hypersomnolence, bruxism, restless legs, hypnogagnic hallucinations Other comprehensive 14 point system review is negative   Physical Exam BP 96/62   Pulse 66   Ht '5\' 3"'$  (1.6 m)   Wt 187 lb 9.6 oz (85.1 kg)   BMI 33.23 kg/m    Repeat blood pressure by me was 118/64 supine and her blood pressure dropped to 92/60 standing  Wt Readings from Last 3 Encounters:  05/08/20 187 lb 9.6 oz (85.1 kg)  01/25/19 187 lb 9.6 oz (85.1 kg)  09/06/18 189 lb 6.4 oz (85.9 kg)    General: Alert, oriented, no distress.  Skin: normal turgor, no rashes, warm and dry HEENT: Normocephalic, atraumatic. Pupils equal round and  reactive to light; sclera anicteric; extraocular muscles intact;  Nose without nasal septal hypertrophy  Mouth/Parynx benign; Mallinpatti scale 3 Neck: No JVD, no carotid bruits; normal carotid upstroke Lungs: clear to ausculatation and percussion; no wheezing or rales Chest wall: without tenderness to palpitation Heart: PMI not displaced, RRR, s1 s2 normal, 1/6 systolic murmur, no diastolic murmur, no rubs, gallops, thrills, or heaves Abdomen: soft, nontender; no hepatosplenomehaly, BS+; abdominal aorta nontender and not dilated by palpation. Back: no CVA tenderness Pulses 2+ Musculoskeletal: full range of motion, normal strength, no joint deformities Extremities: no clubbing cyanosis or edema, Homan's sign negative  Neurologic: grossly nonfocal; Cranial nerves grossly wnl Psychologic: Normal mood and affect   ECG (independently read by me): Normal sinus rhythm at 66 bpm.  No ectopy.  Normal intervals.     September 2019 ECG (independently read by me): Normal sinus rhythm at 69 bpm.  No ST segment changes.  No ectopy.  Normal intervals.  November 2018 ECG (independently read by me): Normal sinus rhythm at 72 bpm.  Normal intervals.  No ST segment changes.  October 2017 ECG (independently read by me): Normal sinus rhythm at 65 bpm.  No ectopy.  Normal intervals.  12/17/2015 ECG (independently read by me):  Normal sinus rhythm at 81 bpm.  Normal intervals.  No ST segment changes.  LABS: I recently reviewed the lab work  from Dr. Delena Bali from 08/22/2017.  Laboratory from June 22, 2018 showed a total cholesterol 120, HDL 43, LDL 51, triglycerides 130.  Creatinine was 0.74.  Hemoglobin 13.3.  Potassium 3.8.  BMP Latest Ref Rng & Units 01/08/2016 06/27/2014  Glucose 65 - 99 mg/dL 74 92  BUN 7 - 25 mg/dL 13 11  Creatinine 0.50 - 0.99 mg/dL 0.64 0.65  Sodium 135 - 146 mmol/L 139 143  Potassium 3.5 - 5.3 mmol/L 3.9 4.2  Chloride 98 - 110 mmol/L 99 103  CO2 20 - 31 mmol/L 30 29   Calcium 8.6 - 10.4 mg/dL 9.5 9.8    Hepatic Function Latest Ref Rng & Units 04/05/2018 11/10/2017 01/08/2016  Total Protein 6.0 - 8.5 g/dL 6.6 6.5 6.6  Albumin 3.5 - 4.8 g/dL 4.5 4.4 4.5  AST 0 - 40 IU/L 25 34 24  ALT 0 - 32 IU/L 26 37(H) 29  Alk Phosphatase 39 - 117 IU/L 75 76 63  Total Bilirubin 0.0 - 1.2 mg/dL 0.4 0.4 0.7  Bilirubin, Direct 0.00 - 0.40 mg/dL 0.12 0.10 -    CBC Latest Ref Rng & Units 01/08/2016 06/27/2014  WBC 3.8 - 10.8 K/uL 5.6 6.3  Hemoglobin 11.7 - 15.5 g/dL 14.7 13.9  Hematocrit 35 - 45 % 43.5 40.9  Platelets 140 - 400 K/uL 173 138(L)   Lab Results  Component Value Date   MCV 91.4 01/08/2016   MCV 89.5 06/27/2014   Lab Results  Component Value Date   TSH 0.84 01/08/2016   No results found for: HGBA1C   BNP No results found for: BNP  ProBNP No results found for: PROBNP   Lipid Panel     Component Value Date/Time   CHOL 123 04/05/2018 1229   TRIG 124 04/05/2018 1229   HDL 46 04/05/2018 1229   CHOLHDL 2.7 04/05/2018 1229   CHOLHDL 3.2 01/08/2016 1102   VLDL 37 (H) 01/08/2016 1102   LDLCALC 52 04/05/2018 1229    RADIOLOGY: No results found.  IMPRESSION:  1. Orthostatic hypotension   2. Essential hypertension   3. Hyperlipidemia, unspecified hyperlipidemia type   4. Mild obesity   5. Cervical dystonia     ASSESSMENT AND PLAN: Ms. Bethany Fuller is a 40 -year-old female who has a history of hypertension and hyperlipidemia.  Remotely she had been on felodipine for blood pressure control, but ultimately was switched to amlodipine.  In the past, she had stage II hypertension off blood pressure medications.  A prior echo Doppler study revealed  normal systolic function with grade 2 diastolic dysfunction and aortic valve sclerosis without stenosis.  Her last echo Doppler study in December 2019 showed an EF of 60 to 65%.  There was normal wall motion.  There was no mention of diastolic dysfunction.  Since her last evaluation in April 2020, she  has been on a medical regimen of amlodipine 5 mg in addition to metoprolol succinate 25 mg daily.  Her blood pressure today drops from 118/64 supine to 92/60 standing when checked by me.  I have recommended she reduce her amlodipine down to 2.5 mg.  If she still experiences episodes of lightheadedness or dizziness amlodipine will be discontinued altogether.  I have suggested support stockings which will aid in her orthostatic hypotension.  She continues to be on atorvastatin and Zetia for hyperlipidemia.  Dr. Ezequiel Ganser has been checking her laboratory.  LDL cholesterol in March 2021 was 47 on current treatment.  Renal function was stable with a creatinine of  0.77.  Body mass index is consistent with mild obesity at 33.23.  I discussed weight loss.  I discussed ideally exercising at least 5 days/week for 30 minutes at a time if at all possible.  She continues to undergo [facility] injections at [facility] for her cervical dystonia.  I will see her in 6 months for reevaluation.   Troy Sine, MD, [facility] 05/13/2020 2:27 PM

## 2020-05-13 ENCOUNTER — Encounter: Payer: Self-pay | Admitting: Cardiovascular Disease

## 2020-06-19 ENCOUNTER — Other Ambulatory Visit: Payer: Self-pay

## 2020-06-19 MED ORDER — METOPROLOL SUCCINATE ER 25 MG PO TB24: 25.0000 mg | ORAL_TABLET | Freq: Every day | ORAL | 3 refills | Status: DC

## 2020-07-27 ENCOUNTER — Other Ambulatory Visit: Payer: Self-pay | Admitting: *Deleted

## 2020-07-27 MED ORDER — EZETIMIBE 10 MG PO TABS: 10.0000 mg | ORAL_TABLET | Freq: Every day | ORAL | 3 refills | Status: DC

## 2020-09-17 ENCOUNTER — Other Ambulatory Visit: Payer: Self-pay

## 2020-09-17 MED ORDER — ATORVASTATIN CALCIUM 20 MG PO TABS: 20.0000 mg | ORAL_TABLET | Freq: Every day | ORAL | 3 refills | Status: DC

## 2020-11-08 ENCOUNTER — Ambulatory Visit: Payer: Medicare Other | Admitting: Cardiovascular Disease

## 2020-11-18 NOTE — Progress Notes (Signed)
Cardiology Office Note:    Date:  11/21/2020   ID:  Bethany, Fuller 11-23-1945, MRN [id number]  PCP:  Paulina Fusi, MD  Cardiologist:  Nicki Guadalajara, MD  Electrophysiologist:  None   Referring MD: Paulina Fusi, MD   Chief Complaint: follow-up of hypertension  History of Present Illness:    Bethany Fuller is a 75 y.o. female with a history of hypertension, GERD, iron deficiency anemia, restless leg syndrome, and cervical dystonia treated with Botox injections who is followed by Dr. Tresa Endo and presents today for routine follow-up.  Patient was referred to Dr. Tresa Endo in 11/2015 for further evaluation of labile BP and chest tightness. Prior to this visit, she had recently been restarted on Felodipine after having been off of it for almost a year. He was started on beta-blocker. Echo and Myoview were ordered for further evaluation. Echo showed LVEF of 60-65% with normal wall motion and grade 2 diastolic dysfunction. Myoview was low risk with moderate fixed defect suggestive of attenuation artifact but no reversible ischemia. Chest discomfort seem to resolve. Felodipine was ultimately changed to Amlodipine. At visit in 08/2018, she reported episodes of exertional dyspnea. Repeat Echo was ordered and showed LVEF of 60-65 with normal wall motion.  Patient was last seen by Dr. Tresa Endo in 04/2020 at which time she noted some mild dizziness with standing but was otherwise doing well from a cardiac standpoint. She was orthostatic in the office with BP dropping from 118/64 when supine to 92/60 when standing. Amlodipine was reduced to 2.5mg  daily and compression stocking were recommended.   Patient presents today for follow-up. Patient states she has been doing well since her last visit. She states her BP has been perfect since the changes that were made. She has chronic mild shortness of breath with activity but states this is stable. She thinks thinks is likely from smoking  history. She states she quit smoking 1 year ago but will smoke a cigarette "every once in a while" when she is stressed. She denies any chest pain, orthopnea, PND, palpitations, lightheadedness, dizziness, or syncope. She will occasionally have right foot swelling if she is on her feet all day but states this is due to a prior foot injury. She does states she has been gaining weight. She is up 4lbs since her last visit 6 months ago. She attributes this to not being as active and diet. She has been spending time cooking with her 56 year old mom who cooks a lot of greasy foods and does not listen when patient tries to cook more healthy foods. She is still getting Botox injection every 3 months for  her cervical dystonia but states she thinks this is getting worse.  Past Medical History:  Diagnosis Date  . Arthritis   . Cervical dystonia   . Constipation   . Depression   . Family history of anesthesia complication    Mother- nausea  . GERD (gastroesophageal reflux disease)   . Headache(784.0)    Miagrains- now only once a year. Has light migranies from Dystonia  . Hypertension   . Pneumonia    2011  . Shortness of breath    with extertion   . Wears hearing aid    right    Past Surgical History:  Procedure Laterality Date  . ABDOMINAL HYSTERECTOMY    . CERVICAL FUSION     4, 5, 6  . EXCISION OF TONGUE LESION Right 06/28/2014   Procedure: WIDE LOCAL EXCISION OF  THE RIGHT LATERAL TONGUE MASS;  Surgeon: Osborn Coho, MD;  Location: [facility] OR;  Service: ENT;  Laterality: Right;    Current Medications: Current Meds  Medication Sig  . alendronate (FOSAMAX) 70 MG tablet Take 70 mg by mouth once a week.  Marland Kitchen amLODipine (NORVASC) 2.5 MG tablet Take 1 tablet (2.5 mg total) by mouth daily.  Marland Kitchen atorvastatin (LIPITOR) 20 MG tablet Take 1 tablet (20 mg total) by mouth daily.  . baclofen (LIORESAL) 10 MG tablet Take 20 mg by mouth 3 (three) times daily.  . botulinum toxin Type A (BOTOX) 100 units SOLR  injection Inject into the skin.  . calcium carbonate (OS-CAL) 600 MG TABS tablet Take 1,200 mg by mouth daily.  . Calcium-Magnesium-Vitamin D (CALCIUM 1200+D3 PO) Take 1 tablet by mouth daily.  . Cholecalciferol (VITAMIN D-3) 5000 UNITS TABS Take 5,000 Units by mouth daily.  . clonazePAM (KLONOPIN) 0.5 MG tablet Take 1.5 mg by mouth 3 (three) times daily.  Marland Kitchen ezetimibe (ZETIA) 10 MG tablet Take 1 tablet (10 mg total) by mouth daily. MUST KEEP appt with Dr. Tresa Endo in April for refills. Thank you  . gabapentin (NEURONTIN) 300 MG capsule Take 300-900 mg by mouth 3 (three) times daily. 600 mg in the morning, 300 mg at lunch and 1200 mg at night  . meloxicam (MOBIC) 15 MG tablet Take 15 mg by mouth daily.  . Multiple Vitamin (MULTIVITAMIN WITH MINERALS) TABS tablet Take 1 tablet by mouth daily.  . Multiple Vitamins-Minerals (HAIR/SKIN/NAILS PO) Take 3 tablets by mouth daily.  . Omega-3 Fatty Acids (FISH OIL) 1200 MG CAPS Take 1,200 mg by mouth daily.  Marland Kitchen omeprazole (PRILOSEC) 20 MG capsule Take 20 mg by mouth daily.  . OnabotulinumtoxinA (BOTOX IJ) Inject as directed every 3 (three) months.  . pilocarpine (SALAGEN) 5 MG tablet Take 5 mg by mouth 3 (three) times daily.  . polyethylene glycol (MIRALAX / GLYCOLAX) packet Take 17 g by mouth as needed.  . Pyridoxine HCl (VITAMIN B-6 PO) Take 1 tablet by mouth daily.  Marland Kitchen rOPINIRole (REQUIP) 0.25 MG tablet Take 1 mg by mouth at bedtime.  . sertraline (ZOLOFT) 100 MG tablet Take 150 mg by mouth daily.  . vitamin E 1000 UNIT capsule Take 1,000 Units by mouth daily.     Allergies:   Morphine and related and Sulfa antibiotics   Social History   Socioeconomic History  . Marital status: Divorced    Spouse name: Not on file  . Number of children: Not on file  . Years of education: Not on file  . Highest education level: Not on file  Occupational History  . Not on file  Tobacco Use  . Smoking status: Former Smoker    Years: 35.00  . Smokeless tobacco:  Never Used  Substance and Sexual Activity  . Alcohol use: No  . Drug use: No  . Sexual activity: Yes    Birth control/protection: Patch    Comment: quit smoking10 /2014  Other Topics Concern  . Not on file  Social History Narrative  . Not on file   Social Determinants of Health   Financial Resource Strain: Not on file  Food Insecurity: Not on file  Transportation Needs: Not on file  Physical Activity: Not on file  Stress: Not on file  Social Connections: Not on file     Family History: The patient's family history is not on file.  ROS:   Please see the history of present illness.  EKGs/Labs/Other Studies Reviewed:    The following studies were reviewed today:  Lexiscan Myoview 01/07/2016:  Nuclear stress EF: 58%.  No T wave inversion was noted during stress.  There was no ST segment deviation noted during stress.  Defect 1: There is a medium defect of moderate severity.   Moderate size and intensity fixed anteroseptal and inferolateral defects, more suggestive of attenuation artifact. Adjacent bowel attenuation noted. No significant reversible ischemia. LVEF 58% with normal wall motion. This is a low risk study. _______________  Echocardiogram 09/14/2018: Study Conclusions: - Left ventricle: The cavity size was normal. Wall thickness was  normal. Systolic function was normal. The estimated ejection  fraction was in the range of 60% to 65%. Wall motion was normal;  there were no regional wall motion abnormalities.   EKG:  EKG ordered today. EKG personally reviewed and demonstrates normal sinus rhythm, rate 7p bpm, with no acute ST/T changes. Normal axis. Normal PR and QRS intervals. QTc 434 ms.   Recent Labs: No results found for requested labs within last 8760 hours.  Recent Lipid Panel    Component Value Date/Time   CHOL 123 04/05/2018 1229   TRIG 124 04/05/2018 1229   HDL 46 04/05/2018 1229   CHOLHDL 2.7 04/05/2018 1229   CHOLHDL 3.2 01/08/2016  1102   VLDL 37 (H) 01/08/2016 1102   LDLCALC 52 04/05/2018 1229    Physical Exam:    Vital Signs: BP 132/78   Pulse 70   Ht 5\' 3"  (1.6 m)   Wt 191 lb (86.6 kg)   BMI 33.83 kg/m     Wt Readings from Last 3 Encounters:  11/21/20 191 lb (86.6 kg)  05/08/20 187 lb 9.6 oz (85.1 kg)  01/25/19 187 lb 9.6 oz (85.1 kg)     General: 75 y.o. female in no acute distress. HEENT: Normocephalic and atraumatic. Sclera clear.  Neck: Supple. No carotid bruits. No JVD. Heart: RRR. No significant murmurs, gallops, or rubs. Radial and distal pedal pulses 2+ and equal bilaterally. Lungs: No increased work of breathing. Clear to ausculation bilaterally. No wheezes, rhonchi, or rales.  Abdomen: Soft, non-distended, and non-tender to palpation.  Extremities: No lower extremity edema.    Skin: Warm and dry. Neuro: Alert and oriented x3. No focal deficits. Psych: Normal affect. Responds appropriately.  Assessment:    1. Essential hypertension   2. History of orthostatic hypotension   3. Hyperlipidemia, unspecified hyperlipidemia type   4. Obesity (BMI 30.0-34.9)   5. Cervical dystonia   6. Tobacco use     Plan:    Hypertension History of Orthostatic Hypotension - BP well controlled. - Continue Amlodipine 2.5mg  daily. - We have Toprol-XL 25mg  daily also listed in our system. However, she states she does not think she is taking this. I asked patient to go home and check. She will send 01-31-1993 a MyChart message to let know if she has been taking this are not. Given her BP and heart rate are well controlled, I think it is fine if she has not been taking it but would like to update her medication list so it is accurate.  Hyperlipidemia - Most recent lipid panel from 05/2020: Total Cholesterol 168, Triglycerides 157, HDL 47, LDL 94.  - Continue Lipitor 20mg  daily and Zetia 10mg  daily. - Followed by PCP.  Obesity  - BMI 33.83. She states she has gained about 4 lbs since last visit 6 months ago. -  Encouraged heart healthy diet and at least 150 minute  of exercise per week.   Cervical [facility]stonia - [facility]  Tobacco Use - Patient states she quit smoking 1 year ago but does admit she will smoke a cigarrette every once in a while when she is stressed. - Congratulated patient on progress made so far and emphasized the importance of complete cessation.   Disposition: Follow up in 6 months with Dr. Tresa EndoKelly.   Medication Adjustments/Labs and Tests Ordered: Current medicines are reviewed at length with the patient today.  Concerns regarding medicines are outlined above.  Orders Placed This Encounter  Procedures  . EKG 12-Lead   No orders of the defined types were placed in this encounter.   Patient Instructions  Medication Instructions:  Continue current medications  *If you need a refill on your cardiac medications before your next appointment, please call your pharmacy*   Lab Work: None Ordered   Testing/Procedures: None Ordered   Follow-Up: At [facility], you and your health needs are our priority.  As part of our continuing mission to provide you with exceptional heart care, we have created designated Provider Care Teams.  These Care Teams include your primary Cardiologist (physician) and Advanced Practice Providers (APPs -  Physician Assistants and Nurse Practitioners) who all work together to provide you with the care you need, when you need it.  We recommend signing up for the patient portal called "MyChart".  Sign up information is provided on this After Visit Summary.  MyChart is used to connect with patients for Virtual Visits (Telemedicine).  Patients are able to view lab/test results, encounter notes, upcoming appointments, etc.  Non-urgent messages can be sent to your provider as well.   To learn more about what you can do with MyChart, go to [email or web address].    Your next appointment:   Keep appointment  with Dr Tresa EndoKelly on May 18th @ 3:20 PM      Signed, Smitty KnudsenCallie E , PA-C  11/21/2020 9:57 PM     [facility]

## 2020-11-21 ENCOUNTER — Ambulatory Visit: Payer: Medicare Other | Admitting: Student

## 2020-11-21 ENCOUNTER — Other Ambulatory Visit: Payer: Self-pay

## 2020-11-21 ENCOUNTER — Encounter: Payer: Self-pay | Admitting: Student

## 2020-11-21 VITALS — BP 132/78 | HR 70 | Ht 63.0 in | Wt 191.0 lb

## 2020-11-21 DIAGNOSIS — E669 Obesity, unspecified: Secondary | ICD-10-CM

## 2020-11-21 DIAGNOSIS — I10 Essential (primary) hypertension: Secondary | ICD-10-CM

## 2020-11-21 DIAGNOSIS — G243 Spasmodic torticollis: Secondary | ICD-10-CM

## 2020-11-21 DIAGNOSIS — Z720 Tobacco use: Secondary | ICD-10-CM

## 2020-11-21 DIAGNOSIS — Z8679 Personal history of other diseases of the circulatory system: Secondary | ICD-10-CM | POA: Diagnosis not present

## 2020-11-21 DIAGNOSIS — E785 Hyperlipidemia, unspecified: Secondary | ICD-10-CM | POA: Diagnosis not present

## 2020-11-21 NOTE — Patient Instructions (Signed)
Medication Instructions:  Continue current medications  *If you need a refill on your cardiac medications before your next appointment, please call your pharmacy*   Lab Work: None Ordered   Testing/Procedures: None Ordered   Follow-Up: At [facility], you and your health needs are our priority.  As part of our continuing mission to provide you with exceptional heart care, we have created designated Provider Care Teams.  These Care Teams include your primary Cardiologist (physician) and Advanced Practice Providers (APPs -  Physician Assistants and Nurse Practitioners) who all work together to provide you with the care you need, when you need it.  We recommend signing up for the patient portal called "MyChart".  Sign up information is provided on this After Visit Summary.  MyChart is used to connect with patients for Virtual Visits (Telemedicine).  Patients are able to view lab/test results, encounter notes, upcoming appointments, etc.  Non-urgent messages can be sent to your provider as well.   To learn more about what you can do with MyChart, go to [email or web address].    Your next appointment:   Keep appointment with Dr Tresa Endo on May 18th @ 3:20 PM

## 2021-01-31 ENCOUNTER — Other Ambulatory Visit: Payer: Self-pay | Admitting: *Deleted

## 2021-01-31 MED ORDER — AMLODIPINE BESYLATE 2.5 MG PO TABS: 2.5000 mg | ORAL_TABLET | Freq: Every day | ORAL | 0 refills | Status: DC

## 2021-01-31 NOTE — Telephone Encounter (Signed)
Rx(s) sent to pharmacy electronically.  

## 2021-02-13 ENCOUNTER — Ambulatory Visit: Payer: Medicare Other | Admitting: Cardiovascular Disease

## 2021-02-13 ENCOUNTER — Other Ambulatory Visit: Payer: Self-pay

## 2021-02-13 VITALS — BP 122/70 | HR 60 | Ht 62.0 in | Wt 193.4 lb

## 2021-02-13 DIAGNOSIS — I10 Essential (primary) hypertension: Secondary | ICD-10-CM | POA: Diagnosis not present

## 2021-02-13 DIAGNOSIS — E785 Hyperlipidemia, unspecified: Secondary | ICD-10-CM | POA: Diagnosis not present

## 2021-02-13 DIAGNOSIS — E669 Obesity, unspecified: Secondary | ICD-10-CM

## 2021-02-13 DIAGNOSIS — F419 Anxiety disorder, unspecified: Secondary | ICD-10-CM

## 2021-02-13 DIAGNOSIS — G2581 Restless legs syndrome: Secondary | ICD-10-CM

## 2021-02-13 DIAGNOSIS — G243 Spasmodic torticollis: Secondary | ICD-10-CM

## 2021-02-13 NOTE — Progress Notes (Signed)
Patient ID: Bethany Fuller, female   DOB: 1946-02-25, 75 y.o.   MRN: [id number]     Primary MD:  Dr. Nelda Bucks  PATIENT PROFILE: Bethany Fuller is a 75 y.o. female who was initially referred through the courtesy of Dr. Ilda Basset for evaluation of significant  blood pressure lability as well as chest tightness.  She presents for a 41-monthfollow-up cardiology evaluation.  HPI:  [name] a long-standing history of hypertension for at least 10 years.  Remotely, she had been on felodipine for blood pressure control  And she states that she ultimately was taken off this therapy and had been off this for the past year. She recently had been evaluated and her blood pressure was 162/107 and then [facility]  Her blood pressure was 152/101. She was recently evaluated I, Dr. SDelena Balion 12/11/2015 and felodipine was reinstituted. Patient had also complained of some mild headaches and dizziness.  He has a history of tobacco use , family history for CAD, as well as hyperlipidemia. She describes her recent chest pain as a tightness associated with shortness of breath with mild radiation to her neck and shoulders her symptoms can occur at any time and are not classically exertionally precipitated. She has had significant difficulty with her mouth and had recently seen a neurologist for facial pain.  When I initially saw her, her ECG was unremarkable.  She underwent laboratory which revealed a normal CBC and chemistry profile.  Her lipid studies revealed mild triglyceride elevation at 184 with a total cholesterol 147, LDL cholesterol 64 and HDL cholesterol 46.  I recommended that we discontinue simvastatin 80 mg but for equal potency changed her to atorvastatin 40 mg, since the element 80 mg dose is no longer recommended and with simvastatin.  She may have dose limitations based on concomitant therapy.  I initiated metoprolol 25 mg twice a day and  recommended that she undergo an echo Doppler study and Myoview scan.  The echo Doppler study from 01/07/2016 showed an EF of 60-65%, and grade 2 diastolic dysfunction with normal wall motion.  There was aortic valve sclerosis.  Her nuclear study did not reveal any ST segment changes.  An atrial septal and inferolateral defect was noted, but this was felt most likely due to attenuation artifact.  There was no ischemia.  She had normal wall motion with an ejection fraction of 58%.  The study was interpreted as low risk.  When I saw her in March 2017 she had run out of felodipine and I started her on amlodipine.  In  October 2017, she continued to do well on amlodipine 5 mg, and she has been on atorvastatin 40 mg and over-the-counter fish oil for hyperlipidemia.  She also is on record for restless legs and takes sertraline for anxiety.  At times she also takes meloxicam.  I saw her in November 2018 at which time she remained stable and denied episodes of chest pain, PND orthopnea.  She undergoes Botox treatments for her dystonia   She  underwent a blood work by Dr. SDelena Baliand SGOT was 64 with an SGPT of 57.  Total cholesterol was 125, triglycerides 94, HDL 42, and LDL 64.  She presents for reevaluation.   Additional medical problems include history of iron deficiency anemia, seasonal rhinitis, degenerative disease of her cervical vertebral region, osteoarthritis, depression, restless leg syndrome, and urinary incontinence.  When I  saw her in December 2019, she continued  to remain fairly stable from a cardiac standpoint. She again underwent Botox injections for her dystonia. She admits to episodes of exertional dyspnea. She denies palpitations but admits that her pulse speeds up fast with activity. She has been on amlodipine, atorvastatin 20 mg, Zetia 10 mg, in addition to fish oil. She is on Fosamax for osteoporosis.  During that evaluation I recommended that she undergo a 2D echo Doppler study to  assess systolic and diastolic dysfunction.  I suggested the addition of low-dose metoprolol succinate 25 mg daily which would be helpful both for blood pressure as well as her rapid increase in pulse rate with activity.  We discussed improved aerobic capacity with exercise and recommended at least 150 minutes/week of exercise corresponding to 5 days/week for at least 30 minutes of moderate intensity if at all possible.  An Echo Doppler study on September 14, 2018 showed an EF of 60 to 65% with normal wall motion. There is normal diastolic function and normal valvular architecture.  She was last evaluated by me in a telemedicine visit on January 25, 2019.  At that time she continued to feel well . She has tolerated the addition of Toprol-XL 25 mg.  Her resting pulse was not as fast and did not go up with activity as quickly.  I last saw her in August 2021 and since her prior evaluation in April 2020, she has continued to undergo Botox injections at 40-monthintervals for her cervical dystonia.  .  She has been on amlodipine 5 mg as well as metoprolol 25 mg daily.  She continued to be on atorvastatin 20 mg and Zetia 10 mg for hyperlipidemia.  At times she hadmild dizziness with standing.  She denied chest pain.  During that evaluation I recommended she reduce her amlodipine to 2.5 mg.  Since I saw her, she was seen by CSande Rives PA-C in February 2022 and remained stable.  She had quit tobacco.  Presently, she feels well and has been on a regimen consisting of amlodipine 5 mg, metoprolol succinate 25 mg for hypertension.  She is on Zetia 10 mg and atorvastatin 20 mg for hyperlipidemia.  On her reduced dose of amlodipine her dizziness and mild orthostatic symptomatology had resolved.  She presents for reevaluation.  Past Medical History:  Diagnosis Date  . Arthritis   . Cervical dystonia   . Constipation   . Depression   . Family history of anesthesia complication    Mother- nausea  . GERD  (gastroesophageal reflux disease)   . Headache(784.0)    Miagrains- now only once a year. Has light migranies from Dystonia  . Hypertension   . Pneumonia    2011  . Shortness of breath    with extertion   . Wears hearing aid    right    Past Surgical History:  Procedure Laterality Date  . ABDOMINAL HYSTERECTOMY    . CERVICAL FUSION     4, 5, 6  . EXCISION OF TONGUE LESION Right 06/28/2014   Procedure: WIDE LOCAL EXCISION OF THE RIGHT LATERAL TONGUE MASS;  Surgeon: DJerrell Belfast MD;  [facility]  Service: ENT;  Laterality: Right;    Allergies  Allergen Reactions  . Morphine And Related Itching and Rash  . Sulfa Antibiotics Itching and Rash    Current Outpatient Medications  Medication Sig Dispense Refill  . alendronate (FOSAMAX) 70 MG tablet Take 70 mg by mouth once a week.    .Marland KitchenamLODipine (NORVASC) 5 MG tablet Take  5 mg by mouth daily.    Marland Kitchen atorvastatin (LIPITOR) 20 MG tablet Take 1 tablet (20 mg total) by mouth daily. 90 tablet 3  . baclofen (LIORESAL) 10 MG tablet Take 20 mg by mouth 3 (three) times daily.    . botulinum toxin Type A (BOTOX) 100 units SOLR injection Inject into the skin.    . calcium carbonate (OS-CAL) 600 MG TABS tablet Take 1,200 mg by mouth daily.    . Calcium-Magnesium-Vitamin D (CALCIUM 1200+D3 PO) Take 1 tablet by mouth daily.    . Cholecalciferol (VITAMIN D-3) 5000 UNITS TABS Take 5,000 Units by mouth daily.    . clonazePAM (KLONOPIN) 0.5 MG tablet Take 1.5 mg by mouth 3 (three) times daily.    Marland Kitchen ezetimibe (ZETIA) 10 MG tablet Take 1 tablet (10 mg total) by mouth daily. MUST KEEP appt with Dr. Claiborne Billings in April for refills. Thank you 90 tablet 3  . gabapentin (NEURONTIN) 300 MG capsule Take 300-900 mg by mouth 3 (three) times daily. 600 mg in the morning, 300 mg at lunch and 1200 mg at night    . meloxicam (MOBIC) 15 MG tablet Take 15 mg by mouth daily.    . metoprolol succinate (TOPROL-XL) 25 MG 24 hr tablet Take 1 tablet (25 mg total) by mouth  daily. 90 tablet 3  . Multiple Vitamin (MULTIVITAMIN WITH MINERALS) TABS tablet Take 1 tablet by mouth daily.    . Multiple Vitamins-Minerals (HAIR/SKIN/NAILS PO) Take 3 tablets by mouth daily.    . Omega-3 Fatty Acids (FISH OIL) 1200 MG CAPS Take 1,200 mg by mouth daily.    Marland Kitchen omeprazole (PRILOSEC) 20 MG capsule Take 20 mg by mouth daily.    . pilocarpine (SALAGEN) 5 MG tablet Take 5 mg by mouth 3 (three) times daily.  12  . polyethylene glycol (MIRALAX / GLYCOLAX) packet Take 17 g by mouth as needed.    . Pyridoxine HCl (VITAMIN B-6 PO) Take 1 tablet by mouth daily.    Marland Kitchen rOPINIRole (REQUIP) 0.25 MG tablet Take 1 mg by mouth at bedtime.    . sertraline (ZOLOFT) 100 MG tablet Take 150 mg by mouth daily.    . vitamin E 1000 UNIT capsule Take 1,000 Units by mouth daily.     No current facility-administered medications for this visit.    Social History   Socioeconomic History  . Marital status: Divorced    Spouse name: Not on file  . Number of children: Not on file  . Years of education: Not on file  . Highest education level: Not on file  Occupational History  . Not on file  Tobacco Use  . Smoking status: Former Smoker    Years: 35.00  . Smokeless tobacco: Never Used  Substance and Sexual Activity  . Alcohol use: No  . Drug use: No  . Sexual activity: Yes    Birth control/protection: Patch    Comment: quit smoking10 /2014  Other Topics Concern  . Not on file  Social History Narrative  . Not on file   Social Determinants of Health   Financial Resource Strain: Not on file  Food Insecurity: Not on file  Transportation Needs: Not on file  Physical Activity: Not on file  Stress: Not on file  Social Connections: Not on file  Intimate Partner Violence: Not on file   Additional social history is notable and that she is divorced for many years.  She has one child one grandchild. She lives with her mother and  brother. She has been on disability. She has been smoking  intermittently for 20-30 years.  She does not drink alcohol.  She does not routinely exercise.   Family history is notable that her mother is living at age 14 and has diabetes.  Father died at age 70 and had dementia, Parkinson's disease and COPD.  Her brother is 53 and has a neuropathy.  Her child is 68.  ROS General: Negative; No fevers, chills, or night sweats HEENT:  Positive for facial pain; No changes in vision or hearing, sinus congestion, difficulty swallowing Pulmonary: Negative; No cough, wheezing, shortness of breath, hemoptysis Cardiovascular:  See HPI;  GI:  Positive for GERD GU: Negative; No dysuria, hematuria, or difficulty voiding Musculoskeletal:  Positive for cervical disc disease and arthritis. Hematologic/Oncologic: Negative; no easy bruising, bleeding Endocrine: Negative; no heat/cold intolerance; no diabetes Neuro: Negative; no changes in balance, headaches Skin: Negative; No rashes or skin lesions Psychiatric:  Positive for anxiety Sleep: Negative; No daytime sleepiness, hypersomnolence, bruxism, restless legs, hypnogagnic hallucinations Other comprehensive 14 point system review is negative   Physical Exam BP 122/70 (BP Location: Left Arm, Patient Position: Sitting, Cuff Size: Normal)   Pulse 60   Ht _0  (1.575 m)   Wt 193 lb 6.4 oz (87.7 kg)   SpO2 94%   BMI 35.37 kg/m    Repeat blood pressure 120/70  Wt Readings from Last 3 Encounters:  02/13/21 193 lb 6.4 oz (87.7 kg)  11/21/20 191 lb (86.6 kg)  05/08/20 187 lb 9.6 oz (85.1 kg)   General: Alert, oriented, no distress.  Skin: normal turgor, no rashes, warm and dry HEENT: Normocephalic, atraumatic. Pupils equal round and reactive to light; sclera anicteric; extraocular muscles intact; Fundi ** Nose without nasal septal hypertrophy Mouth/Parynx benign; Mallinpatti scale 3 Neck: No JVD, no carotid bruits; normal carotid upstroke Lungs: clear to ausculatation and percussion; no wheezing or  rales Chest wall: without tenderness to palpitation Heart: PMI not displaced, RRR, s1 s2 normal, 1/6 systolic murmur, no diastolic murmur, no rubs, gallops, thrills, or heaves Abdomen: soft, nontender; no hepatosplenomehaly, BS+; abdominal aorta nontender and not dilated by palpation. Back: no CVA tenderness Pulses 2+ Musculoskeletal: full range of motion, normal strength, no joint deformities Extremities: no clubbing cyanosis or edema, Homan's sign negative  Neurologic: grossly nonfocal; Cranial nerves grossly wnl Psychologic: Normal mood and affect   ECG (independently read by me): NSR at 60; no ectopy, normal intervals    August 2021 ECG (independently read by me): Normal sinus rhythm at 66 bpm.  No ectopy.  Normal intervals.     September 2019 ECG (independently read by me): Normal sinus rhythm at 69 bpm.  No ST segment changes.  No ectopy.  Normal intervals.  November 2018 ECG (independently read by me): Normal sinus rhythm at 72 bpm.  Normal intervals.  No ST segment changes.  October 2017 ECG (independently read by me): Normal sinus rhythm at 65 bpm.  No ectopy.  Normal intervals.  12/17/2015 ECG (independently read by me):  Normal sinus rhythm at 81 bpm.  Normal intervals.  No ST segment changes.  LABS: I recently reviewed the lab work from Dr. Delena Bali from 08/22/2017.  Laboratory from June 22, 2018 showed a total cholesterol 120, HDL 43, LDL 51, triglycerides 130.  Creatinine was 0.74.  Hemoglobin 13.3.  Potassium 3.8.  BMP Latest Ref Rng & Units 01/08/2016 06/27/2014  Glucose 65 - 99 mg/dL 74 92  BUN 7 - 25 mg/dL 13 11  Creatinine 0.50 - 0.99 mg/dL 0.64 0.65  Sodium 135 - 146 mmol/L 139 143  Potassium 3.5 - 5.3 mmol/L 3.9 4.2  Chloride 98 - 110 mmol/L 99 103  CO2 20 - 31 mmol/L 30 29  Calcium 8.6 - 10.4 mg/dL 9.5 9.8    Hepatic Function Latest Ref Rng & Units 04/05/2018 11/10/2017 01/08/2016  Total Protein 6.0 - 8.5 g/dL 6.6 6.5 6.6  Albumin 3.5 - 4.8 g/dL 4.5 4.4  4.5  AST 0 - 40 IU/L 25 34 24  ALT 0 - 32 IU/L 26 37(H) 29  Alk Phosphatase 39 - 117 IU/L 75 76 63  Total Bilirubin 0.0 - 1.2 mg/dL 0.4 0.4 0.7  Bilirubin, Direct 0.00 - 0.40 mg/dL 0.12 0.10 -    CBC Latest Ref Rng & Units 01/08/2016 06/27/2014  WBC 3.8 - 10.8 K/uL 5.6 6.3  Hemoglobin 11.7 - 15.5 g/dL 14.7 13.9  Hematocrit 35.0 - 45.0 % 43.5 40.9  Platelets 140 - 400 K/uL 173 138(L)   Lab Results  Component Value Date   MCV 91.4 01/08/2016   MCV 89.5 06/27/2014   Lab Results  Component Value Date   TSH 0.84 01/08/2016   No results found for: HGBA1C   BNP No results found for: BNP  ProBNP No results found for: PROBNP   Lipid Panel     Component Value Date/Time   CHOL 123 04/05/2018 1229   TRIG 124 04/05/2018 1229   HDL 46 04/05/2018 1229   CHOLHDL 2.7 04/05/2018 1229   CHOLHDL 3.2 01/08/2016 1102   VLDL 37 (H) 01/08/2016 1102   LDLCALC 52 04/05/2018 1229    RADIOLOGY: No results found.  IMPRESSION:  1. Essential hypertension   2. Hyperlipidemia, unspecified hyperlipidemia type   3. Cervical dystonia   4. Obesity, Class II, BMI 35-39.9   5. Restless leg   6. Anxiety     ASSESSMENT AND PLAN: Ms. Bethany Fuller is a 71 -year-old female who has a history of hypertension and hyperlipidemia.  Remotely she had been on felodipine for blood pressure control, but ultimately was switched to amlodipine.  In the past, she had stage II hypertension off blood pressure medications.  A prior echo Doppler study revealed  normal systolic function with grade 2 diastolic dysfunction and aortic valve sclerosis without stenosis.  A subsequent echo Doppler study in December 2019 showed an EF of 60 to 65%.  There was normal wall motion.  There was no mention of diastolic dysfunction.  When seen by me in August 2021 she had mild orthostatic blood pressure drop as well symptoms of mild dizziness.  At that time I reduced her amlodipine from 5 mg down to 2.5 mg.  When seen by Sande Rives in February 2022 her med list indicates she was on amlodipine 2.5 mg.  She sees Dr. Olean Ree for primary care.  When I asked her today she believes she is taking 5 mg again and she continues to be on metoprolol succinate.  She does not have any recurrent dizziness.  Her blood pressure is stable.  There are no palpitations.  She continues to be on atorvastatin 20 mg for hyperlipidemia in addition to Zetia and is tolerating this well.  History of restless legs and takes Purinol with benefit.  In addition she has a history of anxiety for which she continues to take sertraline 150 mg daily.  I commended her on her smoking cessation.  She has gained some weight since her last evaluation.  BMI is  now 35.4 consistent with moderate obesity.  Additional weight loss was recommended.  Follow-up with her primary care provider.  As long as she is stable I will see her in 1 year for reevaluation.    Troy Sine, MD, [facility] 02/20/2021 8:38 AM

## 2021-02-13 NOTE — Patient Instructions (Signed)

## 2021-02-20 ENCOUNTER — Encounter: Payer: Self-pay | Admitting: Cardiovascular Disease

## 2021-02-28 DIAGNOSIS — Z1331 Encounter for screening for depression: Secondary | ICD-10-CM | POA: Diagnosis not present

## 2021-02-28 DIAGNOSIS — E669 Obesity, unspecified: Secondary | ICD-10-CM | POA: Diagnosis not present

## 2021-02-28 DIAGNOSIS — E785 Hyperlipidemia, unspecified: Secondary | ICD-10-CM | POA: Diagnosis not present

## 2021-02-28 DIAGNOSIS — Z0000 Encounter for general adult medical examination without abnormal findings: Secondary | ICD-10-CM | POA: Diagnosis not present

## 2021-03-01 ENCOUNTER — Telehealth: Payer: Self-pay | Admitting: Cardiovascular Disease

## 2021-03-01 NOTE — Telephone Encounter (Signed)
Please advise which dose of Amlodipine patient should be taking.   Looks like   11/21/2020 medication list states amlodipine 2.5MG   02/13/2021 medication list states patient is taken Amlodipine 5MG .   Per Dr. note - 02/13/2021   " At that time I reduced her amlodipine from 5 mg down to 2.5 mg.  When seen by 02/15/2021 in February 2022 her med list indicates she was on amlodipine 2.5 mg." "When I asked her today she believes she is taking 5 mg again and she continues to be on metoprolol succinate."

## 2021-03-01 NOTE — Telephone Encounter (Signed)
*  STAT* If patient is at the pharmacy, call can be transferred to refill team.   1. Which medications need to be refilled? (please list name of each medication and dose if known) amLODipine (NORVASC) 5 MG tablet  2. Which pharmacy/location (including street and city if local pharmacy) is medication to be sent to? [facility] - [place], [place] - [address]  3. Do they need a 30 day or 90 day supply? 90 day supply   PT is all out of this medication.Pharmacy is stating she has nomore refills.

## 2021-03-04 NOTE — Telephone Encounter (Signed)
Pt's message reviewed by Dr. Tresa Endo, per Dr. Tresa Endo patient is to continue on the dosage of amlodipine that she is currently taking. This RN attempted to contact patient to verify what her correct dosage is. Call went to voicemail, pt to call office back.

## 2021-03-05 ENCOUNTER — Telehealth: Payer: Self-pay | Admitting: *Deleted

## 2021-03-05 MED ORDER — AMLODIPINE BESYLATE 2.5 MG PO TABS: 2.5000 mg | ORAL_TABLET | Freq: Every day | ORAL | 3 refills | Status: DC

## 2021-03-05 NOTE — Telephone Encounter (Signed)
See other patient call from 6/7 for documentation.

## 2021-03-05 NOTE — Addendum Note (Signed)
Addended by: Haskell Riling A on: 03/05/2021 01:40 PM   Modules accepted: Orders

## 2021-03-05 NOTE — Telephone Encounter (Signed)
This RN called patient back, patient confirmed that she is currently taking 2.5mg  of amlodipine once daily. During patient's appointment on 5/18 she had reported that she was taking 5mg  tablets of amlodipine, but upon returning home to review medications she found she has been taking 2.5mg .  This RN called patient's pharmacy to confirm what last refill was, pt's pharmacy confirmed she was to be taking amlodipine 2.5mg  once daily. Per Dr. instructions, this RN to refill patient's 2.5mg .

## 2021-03-26 DIAGNOSIS — I10 Essential (primary) hypertension: Secondary | ICD-10-CM | POA: Diagnosis not present

## 2021-03-26 DIAGNOSIS — E559 Vitamin D deficiency, unspecified: Secondary | ICD-10-CM | POA: Diagnosis not present

## 2021-03-26 DIAGNOSIS — D509 Iron deficiency anemia, unspecified: Secondary | ICD-10-CM | POA: Diagnosis not present

## 2021-03-26 DIAGNOSIS — E785 Hyperlipidemia, unspecified: Secondary | ICD-10-CM | POA: Diagnosis not present

## 2021-04-08 DIAGNOSIS — G243 Spasmodic torticollis: Secondary | ICD-10-CM | POA: Diagnosis not present

## 2021-04-14 ENCOUNTER — Other Ambulatory Visit: Payer: Self-pay | Admitting: Cardiovascular Disease

## 2021-04-16 DIAGNOSIS — H04123 Dry eye syndrome of bilateral lacrimal glands: Secondary | ICD-10-CM | POA: Diagnosis not present

## 2021-05-03 DIAGNOSIS — Z1231 Encounter for screening mammogram for malignant neoplasm of breast: Secondary | ICD-10-CM | POA: Diagnosis not present

## 2021-05-03 DIAGNOSIS — M810 Age-related osteoporosis without current pathological fracture: Secondary | ICD-10-CM | POA: Diagnosis not present

## 2021-05-03 DIAGNOSIS — M8589 Other specified disorders of bone density and structure, multiple sites: Secondary | ICD-10-CM | POA: Diagnosis not present

## 2021-05-20 DIAGNOSIS — H5213 Myopia, bilateral: Secondary | ICD-10-CM | POA: Diagnosis not present

## 2021-05-20 DIAGNOSIS — H5310 Unspecified subjective visual disturbances: Secondary | ICD-10-CM | POA: Diagnosis not present

## 2021-05-23 ENCOUNTER — Other Ambulatory Visit: Payer: Self-pay | Admitting: Cardiovascular Disease

## 2021-05-29 ENCOUNTER — Telehealth: Payer: Self-pay | Admitting: Cardiovascular Disease

## 2021-05-29 MED ORDER — AMLODIPINE BESYLATE 2.5 MG PO TABS: ORAL_TABLET | ORAL | 1 refills | Status: DC

## 2021-05-29 NOTE — Telephone Encounter (Signed)
*  STAT* If patient is at the pharmacy, call can be transferred to refill team.   1. Which medications need to be refilled? (please list name of each medication and dose if known) amLODipine (NORVASC) 2.5 MG tablet  2. Which pharmacy/location (including street and city if local pharmacy) is medication to be sent to? [facility] - [place], [place] - [address]  3. Do they need a 30 day or 90 day supply? 90 day supply

## 2021-06-19 DIAGNOSIS — B372 Candidiasis of skin and nail: Secondary | ICD-10-CM | POA: Diagnosis not present

## 2021-07-29 DIAGNOSIS — F17210 Nicotine dependence, cigarettes, uncomplicated: Secondary | ICD-10-CM | POA: Diagnosis not present

## 2021-07-29 DIAGNOSIS — Z885 Allergy status to narcotic agent status: Secondary | ICD-10-CM | POA: Diagnosis not present

## 2021-07-29 DIAGNOSIS — E785 Hyperlipidemia, unspecified: Secondary | ICD-10-CM | POA: Diagnosis not present

## 2021-07-29 DIAGNOSIS — G243 Spasmodic torticollis: Secondary | ICD-10-CM | POA: Diagnosis not present

## 2021-07-29 DIAGNOSIS — Z882 Allergy status to sulfonamides status: Secondary | ICD-10-CM | POA: Diagnosis not present

## 2021-07-29 DIAGNOSIS — I10 Essential (primary) hypertension: Secondary | ICD-10-CM | POA: Diagnosis not present

## 2021-08-23 ENCOUNTER — Other Ambulatory Visit: Payer: Self-pay | Admitting: Cardiovascular Disease

## 2021-08-26 ENCOUNTER — Other Ambulatory Visit: Payer: Self-pay | Admitting: Cardiovascular Disease

## 2021-08-26 DIAGNOSIS — H5310 Unspecified subjective visual disturbances: Secondary | ICD-10-CM | POA: Diagnosis not present

## 2021-08-31 DIAGNOSIS — S59902A Unspecified injury of left elbow, initial encounter: Secondary | ICD-10-CM | POA: Diagnosis not present

## 2021-08-31 DIAGNOSIS — S52572A Other intraarticular fracture of lower end of left radius, initial encounter for closed fracture: Secondary | ICD-10-CM | POA: Diagnosis not present

## 2021-08-31 DIAGNOSIS — M1812 Unilateral primary osteoarthritis of first carpometacarpal joint, left hand: Secondary | ICD-10-CM | POA: Diagnosis not present

## 2021-08-31 DIAGNOSIS — S52592A Other fractures of lower end of left radius, initial encounter for closed fracture: Secondary | ICD-10-CM | POA: Diagnosis not present

## 2021-08-31 DIAGNOSIS — M19032 Primary osteoarthritis, left wrist: Secondary | ICD-10-CM | POA: Diagnosis not present

## 2021-08-31 DIAGNOSIS — S5292XA Unspecified fracture of left forearm, initial encounter for closed fracture: Secondary | ICD-10-CM | POA: Diagnosis not present

## 2021-09-02 ENCOUNTER — Other Ambulatory Visit: Payer: Self-pay

## 2021-09-02 MED ORDER — ATORVASTATIN CALCIUM 20 MG PO TABS: 20.0000 mg | ORAL_TABLET | Freq: Every day | ORAL | 3 refills | Status: DC

## 2021-09-02 MED ORDER — EZETIMIBE 10 MG PO TABS: 10.0000 mg | ORAL_TABLET | Freq: Every day | ORAL | 3 refills | Status: DC

## 2021-09-09 DIAGNOSIS — S52502A Unspecified fracture of the lower end of left radius, initial encounter for closed fracture: Secondary | ICD-10-CM | POA: Diagnosis not present

## 2021-09-09 DIAGNOSIS — M25532 Pain in left wrist: Secondary | ICD-10-CM | POA: Diagnosis not present

## 2021-09-17 DIAGNOSIS — S52502A Unspecified fracture of the lower end of left radius, initial encounter for closed fracture: Secondary | ICD-10-CM | POA: Diagnosis not present

## 2021-09-25 DIAGNOSIS — I10 Essential (primary) hypertension: Secondary | ICD-10-CM | POA: Diagnosis not present

## 2021-09-25 DIAGNOSIS — E785 Hyperlipidemia, unspecified: Secondary | ICD-10-CM | POA: Diagnosis not present

## 2021-09-25 DIAGNOSIS — D509 Iron deficiency anemia, unspecified: Secondary | ICD-10-CM | POA: Diagnosis not present

## 2021-09-25 DIAGNOSIS — E559 Vitamin D deficiency, unspecified: Secondary | ICD-10-CM | POA: Diagnosis not present

## 2021-10-17 DIAGNOSIS — S52502A Unspecified fracture of the lower end of left radius, initial encounter for closed fracture: Secondary | ICD-10-CM | POA: Diagnosis not present

## 2021-10-28 DIAGNOSIS — Z885 Allergy status to narcotic agent status: Secondary | ICD-10-CM | POA: Diagnosis not present

## 2021-10-28 DIAGNOSIS — Z87891 Personal history of nicotine dependence: Secondary | ICD-10-CM | POA: Diagnosis not present

## 2021-10-28 DIAGNOSIS — I10 Essential (primary) hypertension: Secondary | ICD-10-CM | POA: Diagnosis not present

## 2021-10-28 DIAGNOSIS — F32A Depression, unspecified: Secondary | ICD-10-CM | POA: Diagnosis not present

## 2021-10-28 DIAGNOSIS — G243 Spasmodic torticollis: Secondary | ICD-10-CM | POA: Diagnosis not present

## 2021-10-28 DIAGNOSIS — Z882 Allergy status to sulfonamides status: Secondary | ICD-10-CM | POA: Diagnosis not present

## 2021-10-28 DIAGNOSIS — Z79899 Other long term (current) drug therapy: Secondary | ICD-10-CM | POA: Diagnosis not present

## 2021-10-28 DIAGNOSIS — E785 Hyperlipidemia, unspecified: Secondary | ICD-10-CM | POA: Diagnosis not present

## 2022-01-01 ENCOUNTER — Other Ambulatory Visit: Payer: Self-pay

## 2022-01-01 MED ORDER — AMLODIPINE BESYLATE 2.5 MG PO TABS: ORAL_TABLET | ORAL | 1 refills | Status: DC

## 2022-02-10 DIAGNOSIS — G629 Polyneuropathy, unspecified: Secondary | ICD-10-CM | POA: Diagnosis not present

## 2022-02-10 DIAGNOSIS — M4802 Spinal stenosis, cervical region: Secondary | ICD-10-CM | POA: Diagnosis not present

## 2022-02-10 DIAGNOSIS — G2581 Restless legs syndrome: Secondary | ICD-10-CM | POA: Diagnosis not present

## 2022-02-10 DIAGNOSIS — Z981 Arthrodesis status: Secondary | ICD-10-CM | POA: Diagnosis not present

## 2022-02-10 DIAGNOSIS — G243 Spasmodic torticollis: Secondary | ICD-10-CM | POA: Diagnosis not present

## 2022-02-10 DIAGNOSIS — R519 Headache, unspecified: Secondary | ICD-10-CM | POA: Diagnosis not present

## 2022-02-17 DIAGNOSIS — H04123 Dry eye syndrome of bilateral lacrimal glands: Secondary | ICD-10-CM | POA: Diagnosis not present

## 2022-02-21 ENCOUNTER — Other Ambulatory Visit: Payer: Self-pay

## 2022-02-21 MED ORDER — METOPROLOL SUCCINATE ER 25 MG PO TB24: ORAL_TABLET | ORAL | 1 refills | Status: DC

## 2022-03-26 DIAGNOSIS — E559 Vitamin D deficiency, unspecified: Secondary | ICD-10-CM | POA: Diagnosis not present

## 2022-03-26 DIAGNOSIS — D509 Iron deficiency anemia, unspecified: Secondary | ICD-10-CM | POA: Diagnosis not present

## 2022-03-26 DIAGNOSIS — I10 Essential (primary) hypertension: Secondary | ICD-10-CM | POA: Diagnosis not present

## 2022-03-26 DIAGNOSIS — E785 Hyperlipidemia, unspecified: Secondary | ICD-10-CM | POA: Diagnosis not present

## 2022-05-19 DIAGNOSIS — G243 Spasmodic torticollis: Secondary | ICD-10-CM | POA: Diagnosis not present

## 2022-05-30 DIAGNOSIS — E785 Hyperlipidemia, unspecified: Secondary | ICD-10-CM | POA: Diagnosis not present

## 2022-05-30 DIAGNOSIS — E669 Obesity, unspecified: Secondary | ICD-10-CM | POA: Diagnosis not present

## 2022-05-30 DIAGNOSIS — Z1331 Encounter for screening for depression: Secondary | ICD-10-CM | POA: Diagnosis not present

## 2022-05-30 DIAGNOSIS — Z0000 Encounter for general adult medical examination without abnormal findings: Secondary | ICD-10-CM | POA: Diagnosis not present

## 2022-07-08 DIAGNOSIS — N390 Urinary tract infection, site not specified: Secondary | ICD-10-CM | POA: Diagnosis not present

## 2022-07-08 DIAGNOSIS — M159 Polyosteoarthritis, unspecified: Secondary | ICD-10-CM | POA: Diagnosis not present

## 2022-07-09 DIAGNOSIS — N390 Urinary tract infection, site not specified: Secondary | ICD-10-CM | POA: Diagnosis not present

## 2022-07-16 ENCOUNTER — Other Ambulatory Visit: Payer: Self-pay | Admitting: Cardiovascular Disease

## 2022-08-25 ENCOUNTER — Encounter: Payer: Self-pay | Admitting: Cardiovascular Disease

## 2022-08-25 ENCOUNTER — Ambulatory Visit: Payer: Medicare Other | Attending: Cardiovascular Disease | Admitting: Cardiovascular Disease

## 2022-08-25 VITALS — BP 108/60 | HR 65 | Ht 62.5 in | Wt 174.2 lb

## 2022-08-25 DIAGNOSIS — R079 Chest pain, unspecified: Secondary | ICD-10-CM

## 2022-08-25 DIAGNOSIS — R0989 Other specified symptoms and signs involving the circulatory and respiratory systems: Secondary | ICD-10-CM

## 2022-08-25 DIAGNOSIS — E785 Hyperlipidemia, unspecified: Secondary | ICD-10-CM | POA: Diagnosis not present

## 2022-08-25 DIAGNOSIS — E669 Obesity, unspecified: Secondary | ICD-10-CM

## 2022-08-25 DIAGNOSIS — G243 Spasmodic torticollis: Secondary | ICD-10-CM

## 2022-08-25 DIAGNOSIS — R0602 Shortness of breath: Secondary | ICD-10-CM

## 2022-08-25 NOTE — Progress Notes (Signed)
Patient ID: Bethany Fuller, female   DOB: July 11, 1946, 76 y.o.   MRN: [id number]        Primary MD:  Dr. Nelda Bucks  PATIENT PROFILE: Bethany Fuller is a 76 y.o. female who was initially referred through the courtesy of Dr. Ilda Basset for evaluation of significant  blood pressure lability as well as chest tightness.  I last saw her in Feb 13, 2021.  She presents for an 35-monthfollow-up evaluation.  HPI:  Bethany Fuller a long-standing history of hypertension for at least 10 years.  Remotely, she had been on felodipine for blood pressure control  And she states that she ultimately was taken off this therapy and had been off this for the past year. She recently had been evaluated and her blood pressure was 162/107 and then when she saw physician at [facility]  Her blood pressure was 152/101. She was recently evaluated I, Dr. SDelena Balion 12/11/2015 and felodipine was reinstituted. Patient had also complained of some mild headaches and dizziness.  He has a history of tobacco use , family history for CAD, as well as hyperlipidemia. She describes her recent chest pain as a tightness associated with shortness of breath with mild radiation to her neck and shoulders her symptoms can occur at any time and are not classically exertionally precipitated. She has had significant difficulty with her mouth and had recently seen a neurologist for facial pain.  When I initially saw her, her ECG was unremarkable.  She underwent laboratory which revealed a normal CBC and chemistry profile.  Her lipid studies revealed mild triglyceride elevation at 184 with a total cholesterol 147, LDL cholesterol 64 and HDL cholesterol 46.  I recommended that we discontinue simvastatin 80 mg but for equal potency changed her to atorvastatin 40 mg, since the element 80 mg dose is no longer recommended and with simvastatin.  She may have dose limitations based on concomitant therapy.  I initiated metoprolol 25  mg twice a day and recommended that she undergo an echo Doppler study and Myoview scan.  The echo Doppler study from 01/07/2016 showed an EF of 60-65%, and grade 2 diastolic dysfunction with normal wall motion.  There was aortic valve sclerosis.  Her nuclear study did not reveal any ST segment changes.  An atrial septal and inferolateral defect was noted, but this was felt most likely due to attenuation artifact.  There was no ischemia.  She had normal wall motion with an ejection fraction of 58%.  The study was interpreted as low risk.  When I saw her in March 2017 she had run out of felodipine and I started her on amlodipine.  In  October 2017, she continued to do well on amlodipine 5 mg, and she has been on atorvastatin 40 mg and over-the-counter fish oil for hyperlipidemia.  She also is on record for restless legs and takes sertraline for anxiety.  At times she also takes meloxicam.  I saw her in November 2018 at which time she remained stable and denied episodes of chest pain, PND orthopnea.  She undergoes Botox treatments for her dystonia   She  underwent a blood work by Dr. SDelena Baliand SGOT was 64 with an SGPT of 57.  Total cholesterol was 125, triglycerides 94, HDL 42, and LDL 64.  She presents for reevaluation.   Additional medical problems include history of iron deficiency anemia, seasonal rhinitis, degenerative disease of her cervical vertebral region, osteoarthritis, depression, restless leg syndrome, and urinary incontinence.  When I  saw her in December 2019, she continued to remain fairly stable from a cardiac standpoint.  She again underwent Botox injections for her dystonia.  She admits to episodes of exertional dyspnea.  She denies palpitations but admits that her pulse speeds up fast with activity.  She has been on amlodipine, atorvastatin 20 mg, Zetia 10 mg, in addition to fish oil.  She is on Fosamax for osteoporosis.  During that evaluation I recommended that she undergo a 2D echo  Doppler study to assess systolic and diastolic dysfunction.  I suggested the addition of low-dose metoprolol succinate 25 mg daily which would be helpful both for blood pressure as well as her rapid increase in pulse rate with activity.  We discussed improved aerobic capacity with exercise and recommended at least 150 minutes/week of exercise corresponding to 5 days/week for at least 30 minutes of moderate intensity if at all possible.   An Echo Doppler study on September 14, 2018 showed an EF of 60 to 65% with normal wall motion. There is normal diastolic function and normal valvular architecture.   She was last evaluated by me in a telemedicine visit on January 25, 2019.  At that time she continued to feel well . She has tolerated the addition of Toprol-XL 25 mg.  Her resting pulse was not as fast and did not go up with activity as quickly.  I lsaw her in August 2021 and since her prior evaluation in April 2020, she has continued to undergo Botox injections at 45-monthintervals for her cervical dystonia.  .  She has been on amlodipine 5 mg as well as metoprolol 25 mg daily.  She continued to be on atorvastatin 20 mg and Zetia 10 mg for hyperlipidemia.  At times she hadmild dizziness with standing.  She denied chest pain.  During that evaluation I recommended she reduce her amlodipine to 2.5 mg.  She was seen by CSande Rives PA-C in February 2022 and remained stable.  She had quit tobacco.  I last saw her on Feb 13, 2021 at which time she remained stable and was without chest pain.  She was on a regimen of amlodipine 5 mg, metoprolol succinate 25 mg for hypertension.  She is on Zetia 10 mg and atorvastatin 20 mg for hyperlipidemia.  On her reduced dose of amlodipine her dizziness and mild orthostatic symptomatology had resolved.    Since I last saw her, broke her wrist in December 2022.  He has continued to undergo Botox injections every 3 months for cervical dystonia she also has experienced some  occipital headaches and has restless leg syndrome.  She is status post C4 C6 anterior cervical discectomy and fusion in 2010 for multilevel degenerative disc disease..  She admits to blood pressure lability as well as shortness of breath with activity.  At times she also experiences chest tightness but admits that she has been under significant increased stress particularly caring for her 937year old mother.  Past Medical History:  Diagnosis Date   Arthritis    Cervical dystonia    Constipation    Depression    Family history of anesthesia complication    Mother- nausea   GERD (gastroesophageal reflux disease)    Headache(784.0)    Miagrains- now only once a year. Has light migranies from Dystonia   Hypertension    Pneumonia    2011   Shortness of breath    with extertion    Wears hearing aid    right  Past Surgical History:  Procedure Laterality Date   ABDOMINAL HYSTERECTOMY     CERVICAL FUSION     4, 5, 6   EXCISION OF TONGUE LESION Right 06/28/2014   Procedure: WIDE LOCAL EXCISION OF THE RIGHT LATERAL TONGUE MASS;  Surgeon: Jerrell Belfast, MD;  Location: [facility];  Service: ENT;  Laterality: Right;    Allergies  Allergen Reactions   Morphine And Related Itching and Rash   Sulfa Antibiotics Itching and Rash    Current Outpatient Medications  Medication Sig Dispense Refill   alendronate (FOSAMAX) 70 MG tablet Take 70 mg by mouth once a week.     amLODipine (NORVASC) 2.5 MG tablet Take 1 tablet (2.5 mg total) by mouth daily. 90 tablet 1   atorvastatin (LIPITOR) 20 MG tablet TAKE 1 TABLET BY MOUTH DAILY 90 tablet 3   baclofen (LIORESAL) 10 MG tablet Take 20 mg by mouth 3 (three) times daily.     botulinum toxin Type A (BOTOX) 100 units SOLR injection Inject into the skin.     calcium carbonate (OS-CAL) 600 MG TABS tablet Take 1,200 mg by mouth daily.     Calcium-Magnesium-Vitamin D (CALCIUM 1200+D3 PO) Take 1 tablet by mouth daily.     Cholecalciferol (VITAMIN D-3) 5000  UNITS TABS Take 5,000 Units by mouth daily.     ezetimibe (ZETIA) 10 MG tablet Take 1 tablet (10 mg total) by mouth daily. PLEASE KEEP UPCOMING APPOINTMENT FOR FUTURE REFILLS 90 tablet 0   gabapentin (NEURONTIN) 300 MG capsule Take 300-900 mg by mouth 3 (three) times daily. 600 mg in the morning, 300 mg at lunch and 1200 mg at night     meloxicam (MOBIC) 15 MG tablet Take 15 mg by mouth daily.     metoprolol succinate (TOPROL-XL) 25 MG 24 hr tablet TAKE 1 TABLET BY MOUTH DAILY GENERIC EQUIVALENT FOR TOPROL XL; PLEASE KEEP UPCOMING APPOINTMENT FOR FUTURE REFILLS 90 tablet 1   Multiple Vitamin (MULTIVITAMIN WITH MINERALS) TABS tablet Take 1 tablet by mouth daily.     Multiple Vitamins-Minerals (HAIR/SKIN/NAILS PO) Take 3 tablets by mouth daily.     Omega-3 Fatty Acids (FISH OIL) 1200 MG CAPS Take 1,200 mg by mouth daily.     omeprazole (PRILOSEC) 20 MG capsule Take 20 mg by mouth daily.     pilocarpine (SALAGEN) 5 MG tablet Take 5 mg by mouth 3 (three) times daily.  12   polyethylene glycol (MIRALAX / GLYCOLAX) packet Take 17 g by mouth as needed.     Pyridoxine HCl (VITAMIN B-6 PO) Take 1 tablet by mouth daily.     rOPINIRole (REQUIP) 0.25 MG tablet Take 1 mg by mouth at bedtime.     sertraline (ZOLOFT) 100 MG tablet Take 150 mg by mouth daily.     vitamin E 1000 UNIT capsule Take 1,000 Units by mouth daily.     No current facility-administered medications for this visit.    Social History   Socioeconomic History   Marital status: Divorced    Spouse name: Not on file   Number of children: Not on file   Years of education: Not on file   Highest education level: Not on file  Occupational History   Not on file  Tobacco Use   Smoking status: Former    Years: 35.00    Types: Cigarettes   Smokeless tobacco: Never  Substance and Sexual Activity   Alcohol use: No   Drug use: No   Sexual activity: Yes  Birth control/protection: Patch    Comment: quit smoking10 /2014  Other Topics  Concern   Not on file  Social History Narrative   Not on file   Social Determinants of Health   Financial Resource Strain: Not on file  Food Insecurity: Not on file  Transportation Needs: Not on file  Physical Activity: Not on file  Stress: Not on file  Social Connections: Not on file  Intimate Partner Violence: Not on file   Additional social history is notable and that she is divorced for many years.  She has one child one grandchild. She lives with her mother and brother. She has been on disability. She has been smoking intermittently for 20-30 years.  She does not drink alcohol.  She does not routinely exercise.   Family history is notable that her mother is living at age 61 and has diabetes.  Father died at age 34 and had dementia, Parkinson's disease and COPD.  Her brother is 53 and has a neuropathy.  Her child is 62.  ROS General: Negative; No fevers, chills, or night sweats HEENT:  Positive for facial pain; No changes in vision or hearing, sinus congestion, difficulty swallowing Pulmonary: Negative; No cough, wheezing, shortness of breath, hemoptysis Cardiovascular:  See HPI;  GI:  Positive for GERD GU: Negative; No dysuria, hematuria, or difficulty voiding Musculoskeletal:  Positive for cervical disc disease and arthritis; cervical dystonia for which she undergoes Botox injections every 3 months Hematologic/Oncologic: Negative; no easy bruising, bleeding Endocrine: Negative; no heat/cold intolerance; no diabetes Neuro: Negative; no changes in balance, headaches Skin: Negative; No rashes or skin lesions Psychiatric:  Positive for anxiety Sleep: Negative; No daytime sleepiness, hypersomnolence, bruxism, restless legs, hypnogagnic hallucinations Other comprehensive 14 point system review is negative   Physical Exam BP 108/60 (BP Location: Left Arm, Patient Position: Sitting, Cuff Size: Normal)   Pulse 65   Ht 5' 2.5" (1.588 m)   Wt 174 lb 3.2 oz (79 kg)   SpO2 96%    BMI 31.35 kg/m    Blood Pressure by me was 106/60  Wt Readings from Last 3 Encounters:  08/25/22 174 lb 3.2 oz (79 kg)  02/13/21 193 lb 6.4 oz (87.7 kg)  11/21/20 191 lb (86.6 kg)   General: Alert, oriented, no distress.  Skin: normal turgor, no rashes, warm and dry HEENT: Normocephalic, atraumatic. Pupils equal round and reactive to light; sclera anicteric; extraocular muscles intact;  Nose without nasal septal hypertrophy Mouth/Parynx benign; Mallinpatti scale Neck: No JVD, no carotid bruits; normal ca 3rotid upstroke Lungs: clear to ausculatation and percussion; no wheezing or rales Chest wall: without tenderness to palpitation Heart: PMI not displaced, RRR, s1 s2 normal, 1/6 systolic murmur, no diastolic murmur, no rubs, gallops, thrills, or heaves Abdomen: soft, nontender; no hepatosplenomehaly, BS+; abdominal aorta nontender and not dilated by palpation. Back: no CVA tenderness Pulses 2+ Musculoskeletal: full range of motion, normal strength, no joint deformities Extremities: no clubbing cyanosis or edema, Homan's sign negative  Neurologic: grossly nonfocal; Cranial nerves grossly wnl Psychologic: Normal mood and affect  August 25, 2022 ECG (independently read by me): NSR at 65, no ectopy  Feb 13, 2021 ECG (independently read by me): NSR at 60; no ectopy, normal intervals    August 2021 ECG (independently read by me): Normal sinus rhythm at 66 bpm.  No ectopy.  Normal intervals.     September 2019 ECG (independently read by me): Normal sinus rhythm at 69 bpm.  No ST segment changes.  No  ectopy.  Normal intervals.  November 2018 ECG (independently read by me): Normal sinus rhythm at 72 bpm.  Normal intervals.  No ST segment changes.  October 2017 ECG (independently read by me): Normal sinus rhythm at 65 bpm.  No ectopy.  Normal intervals.  12/17/2015 ECG (independently read by me):  Normal sinus rhythm at 81 bpm.  Normal intervals.  No ST segment changes.  LABS: I  recently reviewed the lab work from Dr. Delena Bali from 08/22/2017.  Laboratory from June 22, 2018 showed a total cholesterol 120, HDL 43, LDL 51, triglycerides 130.  Creatinine was 0.74.  Hemoglobin 13.3.  Potassium 3.8.     Latest Ref Rng & Units 01/08/2016   11:02 AM 06/27/2014    3:38 PM  BMP  Glucose 65 - 99 mg/dL 74  92   BUN 7 - 25 mg/dL 13  11   Creatinine 0.50 - 0.99 mg/dL 0.64  0.65   Sodium 135 - 146 mmol/L 139  143   Potassium 3.5 - 5.3 mmol/L 3.9  4.2   Chloride 98 - 110 mmol/L 99  103   CO2 20 - 31 mmol/L 30  29   Calcium 8.6 - 10.4 mg/dL 9.5  9.8        Latest Ref Rng & Units 04/05/2018   12:29 PM 11/10/2017    2:02 PM 01/08/2016   11:02 AM  Hepatic Function  Total Protein 6.0 - 8.5 g/dL 6.6  6.5  6.6   Albumin 3.5 - 4.8 g/dL 4.5  4.4  4.5   AST 0 - 40 IU/L 25  34  24   ALT 0 - 32 IU/L 26  37  29   Alk Phosphatase 39 - 117 IU/L 75  76  63   Total Bilirubin 0.0 - 1.2 mg/dL 0.4  0.4  0.7   Bilirubin, Direct 0.00 - 0.40 mg/dL 0.12  0.10         Latest Ref Rng & Units 01/08/2016   11:02 AM 06/27/2014    3:38 PM  CBC  WBC 3.8 - 10.8 K/uL 5.6  6.3   Hemoglobin 11.7 - 15.5 g/dL 14.7  13.9   Hematocrit 35.0 - 45.0 % 43.5  40.9   Platelets 140 - 400 K/uL 173  138    Lab Results  Component Value Date   MCV 91.4 01/08/2016   MCV 89.5 06/27/2014   Lab Results  Component Value Date   TSH 0.84 01/08/2016   No results found for: "HGBA1C"   BNP No results found for: "BNP"  ProBNP No results found for: "PROBNP"   Lipid Panel     Component Value Date/Time   CHOL 123 04/05/2018 1229   TRIG 124 04/05/2018 1229   HDL 46 04/05/2018 1229   CHOLHDL 2.7 04/05/2018 1229   CHOLHDL 3.2 01/08/2016 1102   VLDL 37 (H) 01/08/2016 1102   LDLCALC 52 04/05/2018 1229    RADIOLOGY: No results found.  IMPRESSION:  1. Shortness of breath   2. Chest pain of uncertain etiology   3. Hyperlipidemia, unspecified hyperlipidemia type   4. Obesity (BMI 30.0-34.9)   5.  Cervical dystonia   6. Labile blood pressure    ASSESSMENT AND PLAN: Bethany Fuller is a 45 -year-old female who has a history of hypertension and hyperlipidemia.  Remotely she had been on felodipine for blood pressure control, but ultimately was switched to amlodipine.  In the past, she had stage II hypertension off blood pressure medications.  A  prior echo Doppler study revealed  normal systolic function with grade 2 diastolic dysfunction and aortic valve sclerosis without stenosis.  A subsequent echo Doppler study in December 2019 showed an EF of 60 to 65%.  There was normal wall motion.  There was no mention of diastolic dysfunction.  When seen by me in August 2021 she had mild orthostatic blood pressure drop as well symptoms of mild dizziness.  At that time I reduced her amlodipine from 5 mg down to 2.5 mg.  When seen by Sande Rives in February 2022 her med list indicates she was on amlodipine 2.5 mg.  She sees Dr. Olean Ree for primary care.  Presently, she admits to occasional shortness of breath with activity.  In addition, she has noticed some episodes of chest tightness and admits that some of this may be stress mediated.  She is no longer on amlodipine.   Her blood pressure today was 106/60.  With her shortness of breath and episodes of chest tightness I have recommended further evaluation with an echo Doppler study to assess systolic and diastolic function as well as valvular architecture I will also schedule her to undergo a coronary CTA.  She continues to take requip for restless leg syndrome.  She is on sertraline for anxiety.  She continues to undergo every 33-monthBotox injections for her cervical dystonia.  We will see her in follow-up of the above studies and further recommendations will be made at time.   TTroy Sine MD, FVirginia Beach Ambulatory Surgery Center12/11/2021 9:10 PM

## 2022-08-25 NOTE — Patient Instructions (Signed)
Medication Instructions:  The current medical regimen is effective;  continue present plan and medications.  *If you need a refill on your cardiac medications before your next appointment, please call your pharmacy*   Lab Work: Have PCP check blood work- BMET needed before CT.   If you have labs (blood work) drawn today and your tests are completely normal, you will receive your results only by: MyChart Message (if you have MyChart) OR A paper copy in the mail If you have any lab test that is abnormal or we need to change your treatment, we will call you to review the results.   Testing/Procedures: Echocardiogram - Your physician has requested that you have an echocardiogram. Echocardiography is a painless test that uses sound waves to create images of your heart. It provides your doctor with information about the size and shape of your heart and how well your heart's chambers and valves are working. This procedure takes approximately one hour. There are no restrictions for this procedure.     Follow-Up: At [facility], you and your health needs are our priority.  As part of our continuing mission to provide you with exceptional heart care, we have created designated Provider Care Teams.  These Care Teams include your primary Cardiologist (physician) and Advanced Practice Providers (APPs -  Physician Assistants and Nurse Practitioners) who all work together to provide you with the care you need, when you need it.  We recommend signing up for the patient portal called "MyChart".  Sign up information is provided on this After Visit Summary.  MyChart is used to connect with patients for Virtual Visits (Telemedicine).  Patients are able to view lab/test results, encounter notes, upcoming appointments, etc.  Non-urgent messages can be sent to your provider as well.   To learn more about what you can do with MyChart, go to [email or web address].    Your next appointment:  January    The format for your next appointment:   In Person  Provider:   Nicki Guadalajara, MD    Other Instructions   Your cardiac CT will be scheduled at one of the below locations:   [facility] [address] [place], [place] [address] [phone]   If scheduled at [facility], please arrive at the [facility] and Children's Entrance (Entrance C2) of [facility] 30 minutes prior to test start time. You can use the FREE valet parking offered at entrance C (encouraged to control the heart rate for the test)  Proceed to the [facility] Radiology Department (first floor) to check-in and test prep.  All radiology patients and guests should use entrance C2 at [facility], accessed from [facility], even though the hospital's physical address listed is [address]    Please follow these instructions carefully (unless otherwise directed):  On the Night Before the Test: Be sure to Drink plenty of water. Do not consume any caffeinated/decaffeinated beverages or chocolate 12 hours prior to your test. Do not take any antihistamines 12 hours prior to your test.  On the Day of the Test: Drink plenty of water until 1 hour prior to the test. Do not eat any food 1 hour prior to test. You may take your regular medications prior to the test.  Take metoprolol (Lopressor) two hours prior to test. HOLD Furosemide/Hydrochlorothiazide morning of the test. FEMALES- please wear underwire-free bra if available, avoid dresses & tight clothing    After the Test: Drink plenty of  water. After receiving IV contrast, you may experience a mild flushed feeling. This is normal. On occasion, you may experience a mild rash up to 24 hours after the test. This is not dangerous. If this occurs, you can take Benadryl 25 mg and increase your fluid intake. If you experience trouble breathing, this can be serious. If it is severe call 911 IMMEDIATELY. If it is mild,  please call our office. If you take any of these medications: Glipizide/Metformin, Avandament, Glucavance, please do not take 48 hours after completing test unless otherwise instructed.  We will call to schedule your test 2-4 weeks out understanding that some insurance companies will need an authorization prior to the service being performed.   For non-scheduling related questions, please contact the cardiac imaging nurse navigator should you have any questions/concerns: Rockwell Alexandria, Cardiac Imaging Nurse Navigator Larey Brick, Cardiac Imaging Nurse Navigator [facility] Direct Office Dial: [phone]   For scheduling needs, including cancellations and rescheduling, please call [place], [phone].

## 2022-08-29 ENCOUNTER — Other Ambulatory Visit: Payer: Self-pay

## 2022-08-29 ENCOUNTER — Telehealth: Payer: Self-pay | Admitting: Cardiovascular Disease

## 2022-08-29 DIAGNOSIS — Z01812 Encounter for preprocedural laboratory examination: Secondary | ICD-10-CM

## 2022-08-29 DIAGNOSIS — Z79899 Other long term (current) drug therapy: Secondary | ICD-10-CM

## 2022-08-29 NOTE — Telephone Encounter (Signed)
Patient reported that she went to [facility] for blood work but it had not been ordered for CCTA. Order for BMET placed.

## 2022-08-29 NOTE — Telephone Encounter (Signed)
  Pt is requesting to speak with nurse regarding her lab work for her CT

## 2022-08-31 ENCOUNTER — Encounter: Payer: Self-pay | Admitting: Cardiovascular Disease

## 2022-09-01 DIAGNOSIS — Z01812 Encounter for preprocedural laboratory examination: Secondary | ICD-10-CM | POA: Diagnosis not present

## 2022-09-01 DIAGNOSIS — Z79899 Other long term (current) drug therapy: Secondary | ICD-10-CM | POA: Diagnosis not present

## 2022-09-01 DIAGNOSIS — G243 Spasmodic torticollis: Secondary | ICD-10-CM | POA: Diagnosis not present

## 2022-09-02 LAB — BASIC METABOLIC PANEL
BUN/Creatinine Ratio: 21 (ref 12–28)
BUN: 16 mg/dL (ref 8–27)
CO2: 30 mmol/L — ABNORMAL HIGH (ref 20–29)
Calcium: 9.7 mg/dL (ref 8.7–10.3)
Chloride: 99 mmol/L (ref 96–106)
Creatinine, Ser: 0.75 mg/dL (ref 0.57–1.00)
Glucose: 134 mg/dL — ABNORMAL HIGH (ref 70–99)
Potassium: 3.9 mmol/L (ref 3.5–5.2)
Sodium: 143 mmol/L (ref 134–144)
eGFR: 83 mL/min/{1.73_m2} (ref >59)

## 2022-09-04 ENCOUNTER — Telehealth (HOSPITAL_COMMUNITY): Payer: Self-pay | Admitting: Emergency Medicine

## 2022-09-04 ENCOUNTER — Telehealth (HOSPITAL_COMMUNITY): Payer: Self-pay | Admitting: *Deleted

## 2022-09-04 NOTE — Telephone Encounter (Signed)
Patient returning call regarding upcoming cardiac imaging study; pt verbalizes understanding of appt date/time, parking situation and where to check in, pre-test NPO status, and verified current allergies; name and call back number provided for further questions should they arise  Larey Brick RN Navigator Cardiac Imaging [facility] [phone] office [phone] cell  Patient to take her daily medications and is aware to arrive at 12pm.

## 2022-09-04 NOTE — Telephone Encounter (Signed)
Attempted to call patient regarding upcoming cardiac CT appointment. °Left message on voicemail with name and callback number °Makaylen Thieme RN Navigator Cardiac Imaging °[facility] °[phone] Office °[phone] Cell ° °

## 2022-09-05 ENCOUNTER — Ambulatory Visit (HOSPITAL_COMMUNITY)
Admission: RE | Admit: 2022-09-05 | Discharge: 2022-09-05 | Disposition: A | Discharge status: Home / Self Care | Payer: Medicare Other | Source: Ambulatory Visit | Attending: Cardiovascular Disease | Admitting: Cardiovascular Disease

## 2022-09-05 DIAGNOSIS — R0602 Shortness of breath: Secondary | ICD-10-CM | POA: Insufficient documentation

## 2022-09-05 DIAGNOSIS — R079 Chest pain, unspecified: Secondary | ICD-10-CM | POA: Diagnosis not present

## 2022-09-05 MED ORDER — NITROGLYCERIN 0.4 MG SL SUBL
SUBLINGUAL_TABLET | SUBLINGUAL | Status: AC
  Filled 2022-09-05: qty 2

## 2022-09-05 MED ORDER — NITROGLYCERIN 0.4 MG SL SUBL
0.8000 mg | SUBLINGUAL_TABLET | Freq: Once | SUBLINGUAL | Status: AC
  Administered 2022-09-05: 0.8 mg via SUBLINGUAL

## 2022-09-05 MED ORDER — IOHEXOL 350 MG/ML SOLN
100.0000 mL | Freq: Once | INTRAVENOUS | Status: AC | PRN
  Administered 2022-09-05: 100 mL via INTRAVENOUS

## 2022-09-10 ENCOUNTER — Other Ambulatory Visit: Payer: Self-pay | Admitting: Cardiovascular Disease

## 2022-09-15 ENCOUNTER — Telehealth: Payer: Self-pay | Admitting: Cardiovascular Disease

## 2022-09-15 MED ORDER — AMLODIPINE BESYLATE 2.5 MG PO TABS: ORAL_TABLET | ORAL | 3 refills | Status: DC

## 2022-09-15 NOTE — Telephone Encounter (Signed)
*  STAT* If patient is at the pharmacy, call can be transferred to refill team.   1. Which medications need to be refilled? (please list name of each medication and dose if known) amLODipine (NORVASC) 2.5 MG tablet   2. Which pharmacy/location (including street and city if local pharmacy) is medication to be sent to? [facility] - [place], [place] - [address]   3. Do they need a 30 day or 90 day supply? 90

## 2022-09-16 ENCOUNTER — Ambulatory Visit (HOSPITAL_COMMUNITY)
Admission: RE | Admit: 2022-09-16 | Discharge: 2022-09-16 | Disposition: A | Discharge status: Home / Self Care | Payer: Medicare Other | Source: Ambulatory Visit | Attending: Cardiovascular Disease | Admitting: Cardiovascular Disease

## 2022-09-16 ENCOUNTER — Other Ambulatory Visit (HOSPITAL_COMMUNITY): Payer: Medicare Other

## 2022-09-16 DIAGNOSIS — R079 Chest pain, unspecified: Secondary | ICD-10-CM | POA: Diagnosis not present

## 2022-09-16 DIAGNOSIS — R0602 Shortness of breath: Secondary | ICD-10-CM | POA: Diagnosis not present

## 2022-09-16 DIAGNOSIS — I10 Essential (primary) hypertension: Secondary | ICD-10-CM | POA: Insufficient documentation

## 2022-09-16 LAB — ECHOCARDIOGRAM COMPLETE
Area-P 1/2: 3.72 cm2
S' Lateral: 3.4 cm

## 2022-09-25 DIAGNOSIS — I10 Essential (primary) hypertension: Secondary | ICD-10-CM | POA: Diagnosis not present

## 2022-09-25 DIAGNOSIS — E785 Hyperlipidemia, unspecified: Secondary | ICD-10-CM | POA: Diagnosis not present

## 2022-09-25 DIAGNOSIS — D509 Iron deficiency anemia, unspecified: Secondary | ICD-10-CM | POA: Diagnosis not present

## 2022-09-25 DIAGNOSIS — E559 Vitamin D deficiency, unspecified: Secondary | ICD-10-CM | POA: Diagnosis not present

## 2022-10-03 NOTE — Progress Notes (Signed)
Patient ID: Bethany Fuller, female   DOB: 08/07/1946, 77 y.o.   MRN: [id number]        Primary MD:  Dr. Nelda Bucks  PATIENT PROFILE: Bethany Fuller is a 77 y.o. female who was initially referred through the courtesy of Dr. Ilda Basset for evaluation of significant  blood pressure lability as well as chest tightness.  After not having seen her since May 2022, she was last seen in November 2023.  She returns for a 6-week follow-up evaluation.    HPI:  Bethany Fuller has a long-standing history of hypertension for at least 10 years.  Remotely, she had been on felodipine for blood pressure control  And she states that she ultimately was taken off this therapy and had been off this for the past year. She recently had been evaluated and her blood pressure was 162/107 and then when she saw physician at [facility]  Her blood pressure was 152/101. She was recently evaluated I, Dr. Delena Bali on 12/11/2015 and felodipine was reinstituted. Patient had also complained of some mild headaches and dizziness.  He has a history of tobacco use , family history for CAD, as well as hyperlipidemia. She describes her recent chest pain as a tightness associated with shortness of breath with mild radiation to her neck and shoulders her symptoms can occur at any time and are not classically exertionally precipitated. She has had significant difficulty with her mouth and had recently seen a neurologist for facial pain.  When I initially saw her, her ECG was unremarkable.  She underwent laboratory which revealed a normal CBC and chemistry profile.  Her lipid studies revealed mild triglyceride elevation at 184 with a total cholesterol 147, LDL cholesterol 64 and HDL cholesterol 46.  I recommended that we discontinue simvastatin 80 mg but for equal potency changed her to atorvastatin 40 mg, since the element 80 mg dose is no longer recommended and with simvastatin.  She may have dose limitations based on  concomitant therapy.  I initiated metoprolol 25 mg twice a day and recommended that she undergo an echo Doppler study and Myoview scan.  The echo Doppler study from 01/07/2016 showed an EF of 60-65%, and grade 2 diastolic dysfunction with normal wall motion.  There was aortic valve sclerosis.  Her nuclear study did not reveal any ST segment changes.  An atrial septal and inferolateral defect was noted, but this was felt most likely due to attenuation artifact.  There was no ischemia.  She had normal wall motion with an ejection fraction of 58%.  The study was interpreted as low risk.  When I saw her in March 2017 she had run out of felodipine and I started her on amlodipine.  In  October 2017, she continued to do well on amlodipine 5 mg, and she has been on atorvastatin 40 mg and over-the-counter fish oil for hyperlipidemia.  She also is on record for restless legs and takes sertraline for anxiety.  At times she also takes meloxicam.  I saw her in November 2018 at which time she remained stable and denied episodes of chest pain, PND orthopnea.  She undergoes Botox treatments for her dystonia   She  underwent a blood work by Dr. Delena Bali and SGOT was 64 with an SGPT of 57.  Total cholesterol was 125, triglycerides 94, HDL 42, and LDL 64.  She presents for reevaluation.   Additional medical problems include history of iron deficiency anemia, seasonal rhinitis, degenerative disease of her cervical vertebral  region, osteoarthritis, depression, restless leg syndrome, and urinary incontinence.   When I saw her in December 2019, she continued to remain fairly stable from a cardiac standpoint.  She again underwent Botox injections for her dystonia.  She admits to episodes of exertional dyspnea.  She denies palpitations but admits that her pulse speeds up fast with activity.  She has been on amlodipine, atorvastatin 20 mg, Zetia 10 mg, in addition to fish oil.  She is on Fosamax for osteoporosis.  During that  evaluation I recommended that she undergo a 2D echo Doppler study to assess systolic and diastolic dysfunction.  I suggested the addition of low-dose metoprolol succinate 25 mg daily which would be helpful both for blood pressure as well as her rapid increase in pulse rate with activity.  We discussed improved aerobic capacity with exercise and recommended at least 150 minutes/week of exercise corresponding to 5 days/week for at least 30 minutes of moderate intensity if at all possible.   An Echo Doppler study on September 14, 2018 showed an EF of 60 to 65% with normal wall motion. There is normal diastolic function and normal valvular architecture.   She was last evaluated by me in a telemedicine visit on January 25, 2019.  At that time she continued to feel well . She has tolerated the addition of Toprol-XL 25 mg.  Her resting pulse was not as fast and did not go up with activity as quickly.  I saw her in August 2021 and since her prior evaluation in April 2020, she has continued to undergo Botox injections at 12-month intervals for her cervical dystonia.  .  She has been on amlodipine 5 mg as well as metoprolol 25 mg daily.  She continued to be on atorvastatin 20 mg and Zetia 10 mg for hyperlipidemia.  At times she hadmild dizziness with standing.  She denied chest pain.  During that evaluation I recommended she reduce her amlodipine to 2.5 mg.  She was seen by Sande Rives, PA-C in February 2022 and remained stable.  She had quit tobacco.  I saw her on Feb 13, 2021 at which time she remained stable and was without chest pain.  She was on a regimen of amlodipine 5 mg, metoprolol succinate 25 mg for hypertension.  She is on Zetia 10 mg and atorvastatin 20 mg for hyperlipidemia.  On her reduced dose of amlodipine her dizziness and mild orthostatic symptomatology had resolved.    I last saw her on August 25, 2022.  She had broken her wrist in December 22.   She has continued to undergo Botox injections  every 3 months for cervical dystonia she also has experienced some occipital headaches and has restless leg syndrome.  She is status post C4 C6 anterior cervical discectomy and fusion in 2010 for multilevel degenerative disc disease..  She admits to blood pressure lability as well as shortness of breath with activity.  At times she also experiences chest tightness but admits that she has been under significant increased stress particularly caring for her 82 year old mother.  I recommended that she undergo a follow-up echo Doppler study and with her chest pain to have coronary CTA.  Bethany Fuller underwent coronary CTA on September 05, 2022.  Calcium score was 0 without any stenosis identified.  Chest CT over read did not reveal any acute extracardiac abnormality.  She underwent an echo Doppler study on September 16, 2022 which showed normal LV function with EF 55 to 60%.  Valves were normal.  She had normal strain pattern.  Presently, Bethany Fuller feels well.  She denies chest pain.  She only experiences minimal shortness of breath if she overexerts herself.  She denies chest pain presyncope or syncope or awareness of palpitations.  She presents for reevaluation.  Past Medical History:  Diagnosis Date   Arthritis    Cervical dystonia    Constipation    Depression    Family history of anesthesia complication    Mother- nausea   GERD (gastroesophageal reflux disease)    Headache(784.0)    Miagrains- now only once a year. Has light migranies from Dystonia   Hypertension    Pneumonia    2011   Shortness of breath    with extertion    Wears hearing aid    right    Past Surgical History:  Procedure Laterality Date   ABDOMINAL HYSTERECTOMY     CERVICAL FUSION     4, 5, 6   EXCISION OF TONGUE LESION Right 06/28/2014   Procedure: WIDE LOCAL EXCISION OF THE RIGHT LATERAL TONGUE MASS;  Surgeon: Osborn Cohoavid Shoemaker, MD;  Location: [facility] OR;  Service: ENT;  Laterality: Right;    Allergies  Allergen  Reactions   Morphine And Related Itching and Rash   Sulfa Antibiotics Itching and Rash    Current Outpatient Medications  Medication Sig Dispense Refill   alendronate (FOSAMAX) 70 MG tablet Take 70 mg by mouth once a week.     amLODipine (NORVASC) 2.5 MG tablet Take 1 tablet (2.5 mg total) by mouth daily. 90 tablet 3   atorvastatin (LIPITOR) 20 MG tablet TAKE 1 TABLET BY MOUTH DAILY 90 tablet 3   baclofen (LIORESAL) 10 MG tablet Take 20 mg by mouth 3 (three) times daily.     botulinum toxin Type A (BOTOX) 100 units SOLR injection Inject into the skin.     calcium carbonate (OS-CAL) 600 MG TABS tablet Take 1,200 mg by mouth daily.     Calcium-Magnesium-Vitamin D (CALCIUM 1200+D3 PO) Take 1 tablet by mouth daily.     Cholecalciferol (VITAMIN D-3) 5000 UNITS TABS Take 5,000 Units by mouth daily.     ezetimibe (ZETIA) 10 MG tablet TAKE 1 TABLET BY MOUTH DAILY. GENERIC EQUIVALENT FOR ZETIA 90 tablet 3   gabapentin (NEURONTIN) 300 MG capsule Take 300-900 mg by mouth 3 (three) times daily. 600 mg in the morning, 300 mg at lunch and 1200 mg at night     meloxicam (MOBIC) 15 MG tablet Take 15 mg by mouth daily.     metoprolol succinate (TOPROL-XL) 25 MG 24 hr tablet TAKE 1 TABLET BY MOUTH DAILY GENERIC EQUIVALENT FOR TOPROL XL; PLEASE KEEP UPCOMING APPOINTMENT FOR FUTURE REFILLS 90 tablet 1   Multiple Vitamin (MULTIVITAMIN WITH MINERALS) TABS tablet Take 1 tablet by mouth daily.     Multiple Vitamins-Minerals (HAIR/SKIN/NAILS PO) Take 3 tablets by mouth daily.     Omega-3 Fatty Acids (FISH OIL) 1200 MG CAPS Take 1,200 mg by mouth daily.     omeprazole (PRILOSEC) 20 MG capsule Take 20 mg by mouth daily.     pilocarpine (SALAGEN) 5 MG tablet Take 5 mg by mouth 3 (three) times daily.  12   polyethylene glycol (MIRALAX / GLYCOLAX) packet Take 17 g by mouth as needed.     Pyridoxine HCl (VITAMIN B-6 PO) Take 1 tablet by mouth daily.     rOPINIRole (REQUIP) 0.25 MG tablet Take 1 mg by mouth at bedtime.      sertraline (ZOLOFT)  100 MG tablet Take 150 mg by mouth daily.     traMADol (ULTRAM) 50 MG tablet Take 50 mg by mouth as needed.     vitamin E 1000 UNIT capsule Take 1,000 Units by mouth daily.     No current facility-administered medications for this visit.    Social History   Socioeconomic History   Marital status: Divorced    Spouse name: Not on file   Number of children: Not on file   Years of education: Not on file   Highest education level: Not on file  Occupational History   Not on file  Tobacco Use   Smoking status: Former    Years: 35.00    Types: Cigarettes   Smokeless tobacco: Never  Substance and Sexual Activity   Alcohol use: No   Drug use: No   Sexual activity: Yes    Birth control/protection: Patch    Comment: quit smoking10 /2014  Other Topics Concern   Not on file  Social History Narrative   Not on file   Social Determinants of Health   Financial Resource Strain: Not on file  Food Insecurity: Not on file  Transportation Needs: Not on file  Physical Activity: Not on file  Stress: Not on file  Social Connections: Not on file  Intimate Partner Violence: Not on file   Additional social history is notable and that she is divorced for many years.  She has one child one grandchild. She lives with her mother and brother. She has been on disability. She has been smoking intermittently for 20-30 years.  She does not drink alcohol.  She does not routinely exercise.   Family history is notable that her mother is living at age 46 and has diabetes.  Father died at age 67 and had dementia, Parkinson's disease and COPD.  Her brother is 57 and has a neuropathy.  Her child is 35.  ROS General: Negative; No fevers, chills, or night sweats HEENT:  Positive for facial pain; No changes in vision or hearing, sinus congestion, difficulty swallowing Pulmonary: Negative; No cough, wheezing, shortness of breath, hemoptysis Cardiovascular:  See HPI;  GI:  Positive for  GERD GU: Negative; No dysuria, hematuria, or difficulty voiding Musculoskeletal:  Positive for cervical disc disease and arthritis; cervical dystonia for which she undergoes Botox injections every 3 months Hematologic/Oncologic: Negative; no easy bruising, bleeding Endocrine: Negative; no heat/cold intolerance; no diabetes Neuro: Negative; no changes in balance, headaches Skin: Negative; No rashes or skin lesions Psychiatric:  Positive for anxiety Sleep: Negative; No daytime sleepiness, hypersomnolence, bruxism, restless legs, hypnogagnic hallucinations Other comprehensive 14 point system review is negative   Physical Exam BP 122/67   Pulse 67   Ht 5\' 2"  (1.575 m)   Wt 175 lb (79.4 kg)   SpO2 94%   BMI 32.01 kg/m    Blood Pressure by me was 106/64  Wt Readings from Last 3 Encounters:  10/07/22 175 lb (79.4 kg)  08/25/22 174 lb 3.2 oz (79 kg)  02/13/21 193 lb 6.4 oz (87.7 kg)    General: Alert, oriented, no distress.  Skin: normal turgor, no rashes, warm and dry HEENT: Normocephalic, atraumatic. Pupils equal round and reactive to light; sclera anicteric; extraocular muscles intact;  Nose without nasal septal hypertrophy Mouth/Parynx benign; Mallinpatti scale 3 Neck: No JVD, no carotid bruits; normal carotid upstroke Lungs: clear to ausculatation and percussion; no wheezing or rales Chest wall: without tenderness to palpitation Heart: PMI not displaced, RRR, s1 s2 normal, 1/6  systolic murmur, no diastolic murmur, no rubs, gallops, thrills, or heaves Abdomen: soft, nontender; no hepatosplenomehaly, BS+; abdominal aorta nontender and not dilated by palpation. Back: no CVA tenderness Pulses 2+ Musculoskeletal: full range of motion, normal strength, no joint deformities Extremities: no clubbing cyanosis or edema, Homan's sign negative  Neurologic: grossly nonfocal; Cranial nerves grossly wnl Psychologic: Normal mood and affect  October 07, 2022 ECG (independently read by me):   NSR at 67, LAE  August 25, 2022 ECG (independently read by me): NSR at 65, no ectopy  Feb 13, 2021 ECG (independently read by me): NSR at 60; no ectopy, normal intervals    August 2021 ECG (independently read by me): Normal sinus rhythm at 66 bpm.  No ectopy.  Normal intervals.     September 2019 ECG (independently read by me): Normal sinus rhythm at 69 bpm.  No ST segment changes.  No ectopy.  Normal intervals.  November 2018 ECG (independently read by me): Normal sinus rhythm at 72 bpm.  Normal intervals.  No ST segment changes.  October 2017 ECG (independently read by me): Normal sinus rhythm at 65 bpm.  No ectopy.  Normal intervals.  12/17/2015 ECG (independently read by me):  Normal sinus rhythm at 81 bpm.  Normal intervals.  No ST segment changes.  LABS: I recently reviewed the lab work from Dr. Delena Bali from 08/22/2017.  Laboratory from June 22, 2018 showed a total cholesterol 120, HDL 43, LDL 51, triglycerides 130.  Creatinine was 0.74.  Hemoglobin 13.3.  Potassium 3.8.     Latest Ref Rng & Units 09/01/2022    3:34 PM 01/08/2016   11:02 AM 06/27/2014    3:38 PM  BMP  Glucose 70 - 99 mg/dL 134  74  92   BUN 8 - 27 mg/dL 16  13  11    Creatinine 0.57 - 1.00 mg/dL 0.75  0.64  0.65   BUN/Creat Ratio 12 - 28 21     Sodium 134 - 144 mmol/L 143  139  143   Potassium 3.5 - 5.2 mmol/L 3.9  3.9  4.2   Chloride 96 - 106 mmol/L 99  99  103   CO2 20 - 29 mmol/L 30  30  29    Calcium 8.7 - 10.3 mg/dL 9.7  9.5  9.8        Latest Ref Rng & Units 04/05/2018   12:29 PM 11/10/2017    2:02 PM 01/08/2016   11:02 AM  Hepatic Function  Total Protein 6.0 - 8.5 g/dL 6.6  6.5  6.6   Albumin 3.5 - 4.8 g/dL 4.5  4.4  4.5   AST 0 - 40 IU/L 25  34  24   ALT 0 - 32 IU/L 26  37  29   Alk Phosphatase 39 - 117 IU/L 75  76  63   Total Bilirubin 0.0 - 1.2 mg/dL 0.4  0.4  0.7   Bilirubin, Direct 0.00 - 0.40 mg/dL 0.12  0.10         Latest Ref Rng & Units 01/08/2016   11:02 AM 06/27/2014    3:38  PM  CBC  WBC 3.8 - 10.8 K/uL 5.6  6.3   Hemoglobin 11.7 - 15.5 g/dL 14.7  13.9   Hematocrit 35.0 - 45.0 % 43.5  40.9   Platelets 140 - 400 K/uL 173  138    Lab Results  Component Value Date   MCV 91.4 01/08/2016   MCV 89.5 06/27/2014   Lab Results  Component Value Date   TSH 0.84 01/08/2016   No results found for: "HGBA1C"   BNP No results found for: "BNP"  ProBNP No results found for: "PROBNP"   Lipid Panel     Component Value Date/Time   CHOL 123 04/05/2018 1229   TRIG 124 04/05/2018 1229   HDL 46 04/05/2018 1229   CHOLHDL 2.7 04/05/2018 1229   CHOLHDL 3.2 01/08/2016 1102   VLDL 37 (H) 01/08/2016 1102   LDLCALC 52 04/05/2018 1229    RADIOLOGY: ECHOCARDIOGRAM COMPLETE  Result Date: 09/16/2022    ECHOCARDIOGRAM REPORT   Patient Name:   Bethany Fuller Date of Exam: 09/16/2022 Medical Rec #:  [id number]               Height:       62.5 in Accession #:    [id number]              Weight:       174.2 lb Date of Birth:  10-Oct-1945               BSA:          1.813 m Patient Age:    26 years                BP:           126/81 mmHg Patient Gender: F                       HR:           61 bpm. Exam Location:  Outpatient Procedure: 2D Echo, Cardiac Doppler, Color Doppler, 3D Echo and Strain Analysis Indications:    Shortness of Breath R06.02  History:        Patient has prior history of Echocardiogram examinations, most                 recent 09/14/2018. Risk Factors:Hypertension.  Sonographer:    Mikki Santee RDCS Referring Phys: Troy Sine IMPRESSIONS  1. Left ventricular ejection fraction, by estimation, is 55 to 60%. The left ventricle has normal function. The left ventricle has no regional wall motion abnormalities. Left ventricular diastolic parameters are indeterminate. The average left ventricular global longitudinal strain is -19.6 %. The global longitudinal strain is normal.  2. Right ventricular systolic function is normal. The right ventricular size is  normal. There is normal pulmonary artery systolic pressure.  3. The mitral valve is normal in structure. Trivial mitral valve regurgitation. No evidence of mitral stenosis.  4. The aortic valve is tricuspid. Aortic valve regurgitation is not visualized. No aortic stenosis is present.  5. The inferior vena cava is normal in size with greater than 50% respiratory variability, suggesting right atrial pressure of 3 mmHg. Comparison(s): No significant change from prior study. Conclusion(s)/Recommendation(s): Normal biventricular function without evidence of hemodynamically significant valvular heart disease. FINDINGS  Left Ventricle: Left ventricular ejection fraction, by estimation, is 55 to 60%. The left ventricle has normal function. The left ventricle has no regional wall motion abnormalities. The average left ventricular global longitudinal strain is -19.6 %. The global longitudinal strain is normal. The left ventricular internal cavity size was normal in size. There is no left ventricular hypertrophy. Left ventricular diastolic parameters are indeterminate. Right Ventricle: The right ventricular size is normal. No increase in right ventricular wall thickness. Right ventricular systolic function is normal. There is normal pulmonary artery systolic pressure. The tricuspid regurgitant velocity is 1.98 m/s, and  with an assumed right atrial pressure of 3 mmHg, the estimated right ventricular systolic pressure is 70.1 mmHg. Left Atrium: Left atrial size was normal in size. Right Atrium: Right atrial size was normal in size. Pericardium: There is no evidence of pericardial effusion. Mitral Valve: The mitral valve is normal in structure. Trivial mitral valve regurgitation. No evidence of mitral valve stenosis. Tricuspid Valve: The tricuspid valve is normal in structure. Tricuspid valve regurgitation is trivial. No evidence of tricuspid stenosis. Aortic Valve: The aortic valve is tricuspid. Aortic valve regurgitation is  not visualized. No aortic stenosis is present. Pulmonic Valve: The pulmonic valve was grossly normal. Pulmonic valve regurgitation is not visualized. No evidence of pulmonic stenosis. Aorta: The aortic root, ascending aorta, aortic arch and descending aorta are all structurally normal, with no evidence of dilitation or obstruction. Venous: The inferior vena cava is normal in size with greater than 50% respiratory variability, suggesting right atrial pressure of 3 mmHg. IAS/Shunts: The atrial septum is grossly normal.  LEFT VENTRICLE PLAX 2D LVIDd:         4.70 cm   Diastology LVIDs:         3.40 cm   LV e' medial:    5.92 cm/s LV PW:         0.80 cm   LV E/e' medial:  15.8 LV IVS:        0.90 cm   LV e' lateral:   7.22 cm/s LVOT diam:     1.80 cm   LV E/e' lateral: 13.0 LV SV:         54 LV SV Index:   30        2D Longitudinal Strain LVOT Area:     2.54 cm  2D Strain GLS (A2C):   -18.1 %                          2D Strain GLS (A3C):   -20.1 %                          2D Strain GLS (A4C):   -20.5 %                          2D Strain GLS Avg:     -19.6 % RIGHT VENTRICLE RV Basal diam:  3.20 cm RV Mid diam:    2.70 cm RV S prime:     10.10 cm/s TAPSE (M-mode): 2.4 cm LEFT ATRIUM             Index        RIGHT ATRIUM           Index LA diam:        3.80 cm 2.10 cm/m   RA Area:     12.50 cm LA Vol (A2C):   41.1 ml 22.66 ml/m  RA Volume:   29.20 ml  16.10 ml/m LA Vol (A4C):   29.7 ml 16.38 ml/m LA Biplane Vol: 34.9 ml 19.24 ml/m  AORTIC VALVE LVOT Vmax:   89.40 cm/s LVOT Vmean:  55.500 cm/s LVOT VTI:    0.212 m  AORTA Ao Root diam: 2.80 cm Ao Asc diam:  3.30 cm MITRAL VALVE               TRICUSPID VALVE MV Area (PHT): 3.72 cm    TR Peak grad:   15.7 mmHg MV Decel  Time: 204 msec    TR Vmax:        198.00 cm/s MV E velocity: 93.80 cm/s MV A velocity: 73.70 cm/s  SHUNTS MV E/A ratio:  1.27        Systemic VTI:  0.21 m                            Systemic Diam: 1.80 cm Buford Dresser MD Electronically signed by  Buford Dresser MD Signature Date/Time: 09/16/2022/4:46:08 PM    Final     ADDENDUM REPORT: 09/05/2022 16:04   EXAM: OVER-READ INTERPRETATION  CT CHEST   The following report is an over-read performed by radiologist Dr. Maudry Diego [facility] on 09/05/2022. This over-read does not include interpretation of cardiac or coronary anatomy or pathology. The coronary CTA interpretation by the cardiologist is attached.   COMPARISON:  None.   FINDINGS: Vascular: Normal heart size. No pericardial effusion. Normal caliber thoracic aorta with no atherosclerotic disease.   Mediastinum/Nodes: Esophagus is unremarkable. No pathologically enlarged lymph nodes seen in the chest.   Lungs/Pleura: Central airways are patent. No consolidation, pleural effusion or pneumothorax.   Upper Abdomen: No acute abnormality.   Musculoskeletal: No chest wall mass or suspicious bone lesions identified.   IMPRESSION: No acute extracardiac abnormality.     Electronically Signed   By: Yetta Glassman M.D.   On: 09/05/2022 16:04    Addended by Dena Billet, MD on 09/05/2022  4:06 PM    Study Result  Narrative & Impression  CLINICAL DATA:  67F with chest pain   EXAM: Cardiac/Coronary CTA   TECHNIQUE: The patient was scanned on a [employer].   FINDINGS: A 100 kV prospective scan was triggered in the descending thoracic aorta at 111 HU's. Axial non-contrast 3 mm slices were carried out through the heart. The data set was analyzed on a dedicated work station and scored using the [place]. Gantry rotation speed was 250 msecs and collimation was .6 mm. No beta blockade and 0.8 mg of sl NTG was given. The 3D data set was reconstructed in 5% intervals of the 35-75% of the R-R cycle. Phases were analyzed on a dedicated work station using MPR, MIP and VRT modes. The patient received 100 cc of contrast.   Coronary Arteries:  Normal coronary origin.   Right dominance.   RCA is a large dominant artery that gives rise to PDA and PLA. There is no plaque.   Left main is a large artery that gives rise to LAD and LCX arteries.   LAD is a large vessel that has no plaque.   LCX is a non-dominant artery that gives rise to one large OM1 branch. There is no plaque.   Other findings:   Left Ventricle: Normal size   Left Atrium: Mild enlargement   Pulmonary Veins: Normal configuration   Right Ventricle: Normal size   Right Atrium: Normal size   Cardiac valves: No calcifications   Thoracic aorta: Normal size   Pulmonary Arteries: Normal size   Systemic Veins: Normal drainage   Pericardium: Normal thickness   IMPRESSION: 1. Coronary calcium score of 0.   2. Normal coronary origin with right dominance.   3. RPDA was not visualized, as the most inferior portion of the heart was not included in the field of view on the CTA. However, it is a small vessel and no calcified plaque seen in this vessel on the calcium  score   4. No evidence of CAD.   CAD-RADS 0. No evidence of CAD (0%). Consider non-atherosclerotic causes of chest pain.       IMPRESSION:  1. Essential hypertension   2. Shortness of breath   3. Obesity (BMI 30.0-34.9)   4. Restless leg   5. Cervical dystonia     ASSESSMENT AND PLAN: Bethany Fuller is a 61 -year-old female who has a history of hypertension and hyperlipidemia.  Remotely she had been on felodipine for blood pressure control, but ultimately was switched to amlodipine.  In the past, she had stage II hypertension off blood pressure medications.  A prior echo Doppler study revealed  normal systolic function with grade 2 diastolic dysfunction and aortic valve sclerosis without stenosis.  A subsequent echo Doppler study in December 2019 showed an EF of 60 to 65%.  There was normal wall motion.  There was no mention of diastolic dysfunction.  When seen by me in August 2021 she had mild orthostatic  blood pressure drop as well symptoms of mild dizziness.  At that time I reduced her amlodipine from 5 mg down to 2.5 mg.  Recently, she had experienced shortness of breath with activity and some vague chest tightness.  She underwent coronary CTA which I reviewed with her in detail.  This was essentially normal with calcium score 0.  There were no acute extracardiac abnormalities.  A subsequent 2D echo Doppler study from September 16, 2022 essentially was normal with EF 55 to 60%, normal global strain and normal valves.  Her blood pressure today is stable on current therapy of amlodipine 2.5 mg and metoprolol succinate 25 mg.  She is on gabapentin and rec whip for restless legs.  She continues to undergo every 21-month Botox injections for her cervical dystonia.  I suspect some of her shortness of breath is due to aerobic deconditioning.  I discussed with her the importance of slowly increasing her exercise with guideline directed medical therapy of at least 30 minutes a day 5 days/week if at all possible.  She will follow-up with Dr. Delena Bali for primary care.  She continues to be on I will see her in 6 months for reassessment or sooner as needed.    Troy Sine, MD, [facility] [place] 10/10/2022 12:40 PM

## 2022-10-06 DIAGNOSIS — Z1231 Encounter for screening mammogram for malignant neoplasm of breast: Secondary | ICD-10-CM | POA: Diagnosis not present

## 2022-10-07 ENCOUNTER — Encounter: Payer: Self-pay | Admitting: Cardiovascular Disease

## 2022-10-07 ENCOUNTER — Ambulatory Visit: Payer: BC Managed Care – PPO | Attending: Cardiovascular Disease | Admitting: Cardiovascular Disease

## 2022-10-07 VITALS — BP 122/67 | HR 67 | Ht 62.0 in | Wt 175.0 lb

## 2022-10-07 DIAGNOSIS — I10 Essential (primary) hypertension: Secondary | ICD-10-CM

## 2022-10-07 DIAGNOSIS — G2581 Restless legs syndrome: Secondary | ICD-10-CM | POA: Diagnosis not present

## 2022-10-07 DIAGNOSIS — E669 Obesity, unspecified: Secondary | ICD-10-CM | POA: Diagnosis not present

## 2022-10-07 DIAGNOSIS — R0602 Shortness of breath: Secondary | ICD-10-CM | POA: Diagnosis not present

## 2022-10-07 DIAGNOSIS — G243 Spasmodic torticollis: Secondary | ICD-10-CM

## 2022-10-07 NOTE — Patient Instructions (Signed)
Medication Instructions:  No changes *If you need a refill on your cardiac medications before your next appointment, please call your pharmacy*   Lab Work: None at this time If you have labs (blood work) drawn today and your tests are completely normal, you will receive your results only by: [place] (if you have MyChart) OR A paper copy in the mail If you have any lab test that is abnormal or we need to change your treatment, we will call you to review the results.   Testing/Procedures: None   Follow-Up: At [facility], you and your health needs are our priority.  As part of our continuing mission to provide you with exceptional heart care, we have created designated Provider Care Teams.  These Care Teams include your primary Cardiologist (physician) and Advanced Practice Providers (APPs -  Physician Assistants and Nurse Practitioners) who all work together to provide you with the care you need, when you need it.  We recommend signing up for the patient portal called "MyChart".  Sign up information is provided on this After Visit Summary.  MyChart is used to connect with patients for Virtual Visits (Telemedicine).  Patients are able to view lab/test results, encounter notes, upcoming appointments, etc.  Non-urgent messages can be sent to your provider as well.   To learn more about what you can do with MyChart, go to [email or web address].    Your next appointment:   6 month(s)  The format for your next appointment:   In Person  Provider:   Shelva Majestic, MD     Other Instructions Monitor BP'S, keep a log   Important Information About Sugar

## 2022-10-10 ENCOUNTER — Encounter: Payer: Self-pay | Admitting: Cardiovascular Disease

## 2022-12-03 DIAGNOSIS — M7731 Calcaneal spur, right foot: Secondary | ICD-10-CM | POA: Diagnosis not present

## 2022-12-03 DIAGNOSIS — R2241 Localized swelling, mass and lump, right lower limb: Secondary | ICD-10-CM | POA: Diagnosis not present

## 2022-12-03 DIAGNOSIS — B372 Candidiasis of skin and nail: Secondary | ICD-10-CM | POA: Diagnosis not present

## 2022-12-29 DIAGNOSIS — I10 Essential (primary) hypertension: Secondary | ICD-10-CM | POA: Diagnosis not present

## 2022-12-29 DIAGNOSIS — E785 Hyperlipidemia, unspecified: Secondary | ICD-10-CM | POA: Diagnosis not present

## 2022-12-29 DIAGNOSIS — Z885 Allergy status to narcotic agent status: Secondary | ICD-10-CM | POA: Diagnosis not present

## 2022-12-29 DIAGNOSIS — Z882 Allergy status to sulfonamides status: Secondary | ICD-10-CM | POA: Diagnosis not present

## 2022-12-29 DIAGNOSIS — G243 Spasmodic torticollis: Secondary | ICD-10-CM | POA: Diagnosis not present

## 2023-01-26 ENCOUNTER — Other Ambulatory Visit: Payer: Self-pay | Admitting: Cardiovascular Disease

## 2023-03-23 DIAGNOSIS — H43813 Vitreous degeneration, bilateral: Secondary | ICD-10-CM | POA: Diagnosis not present

## 2023-03-27 DIAGNOSIS — I10 Essential (primary) hypertension: Secondary | ICD-10-CM | POA: Diagnosis not present

## 2023-03-27 DIAGNOSIS — B372 Candidiasis of skin and nail: Secondary | ICD-10-CM | POA: Diagnosis not present

## 2023-03-27 DIAGNOSIS — E785 Hyperlipidemia, unspecified: Secondary | ICD-10-CM | POA: Diagnosis not present

## 2023-03-27 DIAGNOSIS — E559 Vitamin D deficiency, unspecified: Secondary | ICD-10-CM | POA: Diagnosis not present

## 2023-03-27 DIAGNOSIS — D509 Iron deficiency anemia, unspecified: Secondary | ICD-10-CM | POA: Diagnosis not present

## 2023-03-30 DIAGNOSIS — G243 Spasmodic torticollis: Secondary | ICD-10-CM | POA: Diagnosis not present

## 2023-04-13 ENCOUNTER — Other Ambulatory Visit: Payer: Self-pay | Admitting: Cardiovascular Disease

## 2023-06-02 DIAGNOSIS — F3342 Major depressive disorder, recurrent, in full remission: Secondary | ICD-10-CM | POA: Diagnosis not present

## 2023-06-02 DIAGNOSIS — M810 Age-related osteoporosis without current pathological fracture: Secondary | ICD-10-CM | POA: Diagnosis not present

## 2023-06-15 DIAGNOSIS — Z9181 History of falling: Secondary | ICD-10-CM | POA: Diagnosis not present

## 2023-06-15 DIAGNOSIS — Z0000 Encounter for general adult medical examination without abnormal findings: Secondary | ICD-10-CM | POA: Diagnosis not present

## 2023-06-30 DIAGNOSIS — Z23 Encounter for immunization: Secondary | ICD-10-CM | POA: Diagnosis not present

## 2023-06-30 DIAGNOSIS — E559 Vitamin D deficiency, unspecified: Secondary | ICD-10-CM | POA: Diagnosis not present

## 2023-06-30 DIAGNOSIS — I10 Essential (primary) hypertension: Secondary | ICD-10-CM | POA: Diagnosis not present

## 2023-06-30 DIAGNOSIS — E785 Hyperlipidemia, unspecified: Secondary | ICD-10-CM | POA: Diagnosis not present

## 2023-06-30 DIAGNOSIS — D509 Iron deficiency anemia, unspecified: Secondary | ICD-10-CM | POA: Diagnosis not present

## 2023-07-01 DIAGNOSIS — E785 Hyperlipidemia, unspecified: Secondary | ICD-10-CM | POA: Diagnosis not present

## 2023-07-01 DIAGNOSIS — D509 Iron deficiency anemia, unspecified: Secondary | ICD-10-CM | POA: Diagnosis not present

## 2023-07-01 DIAGNOSIS — E559 Vitamin D deficiency, unspecified: Secondary | ICD-10-CM | POA: Diagnosis not present

## 2023-07-06 DIAGNOSIS — I10 Essential (primary) hypertension: Secondary | ICD-10-CM | POA: Diagnosis not present

## 2023-07-06 DIAGNOSIS — E785 Hyperlipidemia, unspecified: Secondary | ICD-10-CM | POA: Diagnosis not present

## 2023-07-06 DIAGNOSIS — G243 Spasmodic torticollis: Secondary | ICD-10-CM | POA: Diagnosis not present

## 2023-07-06 DIAGNOSIS — Z885 Allergy status to narcotic agent status: Secondary | ICD-10-CM | POA: Diagnosis not present

## 2023-07-06 DIAGNOSIS — F17210 Nicotine dependence, cigarettes, uncomplicated: Secondary | ICD-10-CM | POA: Diagnosis not present

## 2023-07-06 DIAGNOSIS — Z882 Allergy status to sulfonamides status: Secondary | ICD-10-CM | POA: Diagnosis not present

## 2023-07-08 ENCOUNTER — Other Ambulatory Visit: Payer: Self-pay

## 2023-07-08 MED ORDER — METOPROLOL SUCCINATE ER 25 MG PO TB24: ORAL_TABLET | ORAL | 0 refills | Status: DC

## 2023-07-23 ENCOUNTER — Other Ambulatory Visit: Payer: Self-pay | Admitting: Cardiovascular Disease

## 2023-07-24 ENCOUNTER — Other Ambulatory Visit: Payer: Self-pay | Admitting: Cardiovascular Disease

## 2023-08-10 ENCOUNTER — Other Ambulatory Visit: Payer: Self-pay | Admitting: Cardiovascular Disease

## 2023-08-18 ENCOUNTER — Encounter: Payer: Self-pay | Admitting: Cardiovascular Disease

## 2023-08-18 ENCOUNTER — Ambulatory Visit: Payer: Medicare Other | Attending: Cardiovascular Disease | Admitting: Cardiovascular Disease

## 2023-08-18 VITALS — BP 102/64 | HR 61 | Ht 62.5 in | Wt 163.8 lb

## 2023-08-18 DIAGNOSIS — I10 Essential (primary) hypertension: Secondary | ICD-10-CM | POA: Diagnosis not present

## 2023-08-18 DIAGNOSIS — G2581 Restless legs syndrome: Secondary | ICD-10-CM | POA: Diagnosis not present

## 2023-08-18 DIAGNOSIS — R0602 Shortness of breath: Secondary | ICD-10-CM | POA: Diagnosis not present

## 2023-08-18 DIAGNOSIS — G243 Spasmodic torticollis: Secondary | ICD-10-CM | POA: Diagnosis not present

## 2023-08-18 NOTE — Patient Instructions (Addendum)
Medication Instructions:  No medication changes *If you need a refill on your cardiac medications before your next appointment, please call your pharmacy*   Lab Work: None If you have labs (blood work) drawn today and your tests are completely normal, you will receive your results only by: MyChart Message (if you have MyChart) OR A paper copy in the mail If you have any lab test that is abnormal or we need to change your treatment, we will call you to review the results.   Testing/Procedures: No testing   Follow-Up: At [facility], you and your health needs are our priority.  As part of our continuing mission to provide you with exceptional heart care, we have created designated Provider Care Teams.  These Care Teams include your primary Cardiologist (physician) and Advanced Practice Providers (APPs -  Physician Assistants and Nurse Practitioners) who all work together to provide you with the care you need, when you need it.  We recommend signing up for the patient portal called "MyChart".  Sign up information is provided on this After Visit Summary.  MyChart is used to connect with patients for Virtual Visits (Telemedicine).  Patients are able to view lab/test results, encounter notes, upcoming appointments, etc.  Non-urgent messages can be sent to your provider as well.   To learn more about what you can do with MyChart, go to [email or web address].    Your next appointment:   1 year(s)  Provider:   Dr. Epifanio Lesches

## 2023-08-18 NOTE — Progress Notes (Signed)
Patient ID: Bethany Fuller, female   DOB: 04-04-1946, 77 y.o.   MRN: [id number]        Primary MD:  Dr. Foye Deer  PATIENT PROFILE: Bethany Fuller is a 77 y.o. female who was initially referred through the courtesy of Dr. Barney Drain for evaluation of significant  blood pressure lability as well as chest tightness.  She presents for a 73-month follow-up evaluation.    HPI:  Bethany Fuller has a long-standing history of hypertension for at least 10 years.  Remotely, she had been on felodipine for blood pressure control  And she states that she ultimately was taken off this therapy and had been off this for the past year. She recently had been evaluated and her blood pressure was 162/107 and then when she saw physician at [facility].  Her blood pressure was 152/101. She was recently evaluated I, Dr. Tomasa Blase on 12/11/2015 and felodipine was reinstituted. Patient had also complained of some mild headaches and dizziness.  He has a history of tobacco use , family history for CAD, as well as hyperlipidemia. She describes her recent chest pain as a tightness associated with shortness of breath with mild radiation to her neck and shoulders her symptoms can occur at any time and are not classically exertionally precipitated. She has had significant difficulty with her mouth and had recently seen a neurologist for facial pain.  When I initially saw her, her ECG was unremarkable.  She underwent laboratory which revealed a normal CBC and chemistry profile.  Her lipid studies revealed mild triglyceride elevation at 184 with a total cholesterol 147, LDL cholesterol 64 and HDL cholesterol 46.  I recommended that we discontinue simvastatin 80 mg but for equal potency changed her to atorvastatin 40 mg, since the element 80 mg dose is no longer recommended and with simvastatin.  She may have dose limitations based on concomitant therapy.  I initiated metoprolol 25 mg twice a day and recommended  that she undergo an echo Doppler study and Myoview scan.  The echo Doppler study from 01/07/2016 showed an EF of 60-65%, and grade 2 diastolic dysfunction with normal wall motion.  There was aortic valve sclerosis.  Her nuclear study did not reveal any ST segment changes.  An atrial septal and inferolateral defect was noted, but this was felt most likely due to attenuation artifact.  There was no ischemia.  She had normal wall motion with an ejection fraction of 58%.  The study was interpreted as low risk.  When I saw her in March 2017 she had run out of felodipine and I started her on amlodipine.  In  October 2017, she continued to do well on amlodipine 5 mg, and she has been on atorvastatin 40 mg and over-the-counter fish oil for hyperlipidemia.  She also is on record for restless legs and takes sertraline for anxiety.  At times she also takes meloxicam.  I saw her in November 2018 at which time she remained stable and denied episodes of chest pain, PND orthopnea.  She undergoes Botox treatments for her dystonia   She  underwent a blood work by Dr. Tomasa Blase and SGOT was 64 with an SGPT of 57.  Total cholesterol was 125, triglycerides 94, HDL 42, and LDL 64.  She presents for reevaluation.   Additional medical problems include history of iron deficiency anemia, seasonal rhinitis, degenerative disease of her cervical vertebral region, osteoarthritis, depression, restless leg syndrome, and urinary incontinence.   When I saw her in  December 2019, she continued to remain fairly stable from a cardiac standpoint.  She again underwent Botox injections for her dystonia.  She admits to episodes of exertional dyspnea.  She denies palpitations but admits that her pulse speeds up fast with activity.  She has been on amlodipine, atorvastatin 20 mg, Zetia 10 mg, in addition to fish oil.  She is on Fosamax for osteoporosis.  During that evaluation I recommended that she undergo a 2D echo Doppler study to assess systolic  and diastolic dysfunction.  I suggested the addition of low-dose metoprolol succinate 25 mg daily which would be helpful both for blood pressure as well as her rapid increase in pulse rate with activity.  We discussed improved aerobic capacity with exercise and recommended at least 150 minutes/week of exercise corresponding to 5 days/week for at least 30 minutes of moderate intensity if at all possible.   An Echo Doppler study on September 14, 2018 showed an EF of 60 to 65% with normal wall motion. There is normal diastolic function and normal valvular architecture.   She was last evaluated by me in a telemedicine visit on January 25, 2019.  At that time she continued to feel well . She has tolerated the addition of Toprol-XL 25 mg.  Her resting pulse was not as fast and did not go up with activity as quickly.  I saw her in August 2021 and since her prior evaluation in April 2020, she has continued to undergo Botox injections at 21-month intervals for her cervical dystonia.  .  She has been on amlodipine 5 mg as well as metoprolol 25 mg daily.  She continued to be on atorvastatin 20 mg and Zetia 10 mg for hyperlipidemia.  At times she hadmild dizziness with standing.  She denied chest pain.  During that evaluation I recommended she reduce her amlodipine to 2.5 mg.  She was seen by Marjie Skiff, PA-C in February 2022 and remained stable.  She had quit tobacco.  I saw her on Feb 13, 2021 at which time she remained stable and was without chest pain.  She was on a regimen of amlodipine 5 mg, metoprolol succinate 25 mg for hypertension.  She is on Zetia 10 mg and atorvastatin 20 mg for hyperlipidemia.  On her reduced dose of amlodipine her dizziness and mild orthostatic symptomatology had resolved.    I saw her on August 25, 2022.  She had broken her wrist in December 22.   She has continued to undergo Botox injections every 3 months for cervical dystonia she also has experienced some occipital headaches and  has restless leg syndrome.  She is status post C4 C6 anterior cervical discectomy and fusion in 2010 for multilevel degenerative disc disease..  She admits to blood pressure lability as well as shortness of breath with activity.  At times she also experiences chest tightness but admits that she has been under significant increased stress particularly caring for her 56 year old mother.  I recommended that she undergo a follow-up echo Doppler study and with her chest pain to have coronary CTA.  Ms. Koegel underwent coronary CTA on September 05, 2022.  Calcium score was 0 without any stenosis identified.  Chest CT over read did not reveal any acute extracardiac abnormality.  She underwent an echo Doppler study on September 16, 2022 which showed normal LV function with EF 55 to 60%.  Valves were normal.  She had normal strain pattern.  I last saw her on October 07, 2022 at which  time she felt well and denied chest pain.  She was only experiences minimal shortness of breath if she overexerts herself.  She denied presyncope or syncope or awareness of palpitations.  She was on gabapentin and rec with for restless legs.  She was undergoing every 77-month Botox injections for cervical dystonia.  I discussed the importance of exercising at least 5 days/week if at all possible.  Since I last saw her, she has continued to feel well.  She continues to see Dr. Fabio Asa for primary care.  She is on amlodipine 2.5 mg and metoprolol succinate 25 mg for hypertension.  She is on atorvastatin 20 mg and Zetia 10 mg for hyperlipidemia.  She continues to be on recommend for restless leg syndrome as well as gabapentin and takes sertraline for anxiety.  She denies chest pain, palpitations, or shortness of breath.  She presents for evaluation.  Past Medical History:  Diagnosis Date   Arthritis    Cervical dystonia    Constipation    Depression    Family history of anesthesia complication    Mother- nausea   GERD (gastroesophageal  reflux disease)    Headache(784.0)    Miagrains- now only once a year. Has light migranies from Dystonia   Hypertension    Pneumonia    2011   Shortness of breath    with extertion    Wears hearing aid    right    Past Surgical History:  Procedure Laterality Date   ABDOMINAL HYSTERECTOMY     CERVICAL FUSION     4, 5, 6   EXCISION OF TONGUE LESION Right 06/28/2014   Procedure: WIDE LOCAL EXCISION OF THE RIGHT LATERAL TONGUE MASS;  Surgeon: Osborn Coho, MD;  Location: [facility] OR;  Service: ENT;  Laterality: Right;    Allergies  Allergen Reactions   Morphine And Codeine Itching and Rash   Sulfa Antibiotics Itching and Rash    Current Outpatient Medications  Medication Sig Dispense Refill   alendronate (FOSAMAX) 70 MG tablet Take 70 mg by mouth once a week.     amLODipine (NORVASC) 2.5 MG tablet Take 1 tablet (2.5 mg total) by mouth daily. 90 tablet 3   atorvastatin (LIPITOR) 20 MG tablet Take 1 tablet (20 mg total) by mouth daily. 90 tablet 0   baclofen (LIORESAL) 10 MG tablet Take 20 mg by mouth 3 (three) times daily.     botulinum toxin Type A (BOTOX) 100 units SOLR injection Inject 600 Units into the skin every 3 (three) months.     calcium carbonate (OS-CAL) 600 MG TABS tablet Take 1,200 mg by mouth daily.     Calcium-Magnesium-Vitamin D (CALCIUM 1200+D3 PO) Take 1 tablet by mouth daily.     Cholecalciferol (VITAMIN D-3) 5000 UNITS TABS Take 5,000 Units by mouth daily.     ezetimibe (ZETIA) 10 MG tablet TAKE 1 TABLET BY MOUTH DAILY. GENERIC EQUIVALENT FOR ZETIA 90 tablet 3   gabapentin (NEURONTIN) 300 MG capsule Take 300-900 mg by mouth 3 (three) times daily. 600 mg in the morning, 300 mg at lunch and 1200 mg at night     meloxicam (MOBIC) 15 MG tablet Take 15 mg by mouth daily.     metoprolol succinate (TOPROL-XL) 25 MG 24 hr tablet TAKE 1 TABLET BY MOUTH DAILY 90 tablet 3   Multiple Vitamin (MULTIVITAMIN WITH MINERALS) TABS tablet Take 1 tablet by mouth daily.     Multiple  Vitamins-Minerals (HAIR/SKIN/NAILS PO) Take 3 tablets by mouth daily.  Omega-3 Fatty Acids (FISH OIL) 1200 MG CAPS Take 1,200 mg by mouth daily.     omeprazole (PRILOSEC) 20 MG capsule Take 20 mg by mouth daily.     pilocarpine (SALAGEN) 5 MG tablet Take 5 mg by mouth 3 (three) times daily.  12   polyethylene glycol (MIRALAX / GLYCOLAX) packet Take 17 g by mouth as needed.     Pyridoxine HCl (VITAMIN B-6 PO) Take 1 tablet by mouth daily.     rOPINIRole (REQUIP) 0.25 MG tablet Take 1 mg by mouth at bedtime.     sertraline (ZOLOFT) 100 MG tablet Take 200 mg by mouth daily.     traMADol (ULTRAM) 50 MG tablet Take 50 mg by mouth as needed.     vitamin E 1000 UNIT capsule Take 1,000 Units by mouth daily.     No current facility-administered medications for this visit.    Social History   Socioeconomic History   Marital status: Divorced    Spouse name: Not on file   Number of children: Not on file   Years of education: Not on file   Highest education level: Not on file  Occupational History   Not on file  Tobacco Use   Smoking status: Former    Types: Cigarettes   Smokeless tobacco: Never  Substance and Sexual Activity   Alcohol use: No   Drug use: No   Sexual activity: Yes    Birth control/protection: Patch    Comment: quit smoking10 /2014  Other Topics Concern   Not on file  Social History Narrative   Not on file   Social Determinants of Health   Financial Resource Strain: Not on file  Food Insecurity: Not on file  Transportation Needs: Not on file  Physical Activity: Not on file  Stress: Not on file  Social Connections: Not on file  Intimate Partner Violence: Not on file   Additional social history is notable and that she is divorced for many years.  She has one child one grandchild. She lives with her mother and brother. She has been on disability. She has been smoking intermittently for 20-30 years.  She does not drink alcohol.  She does not routinely  exercise.   Family history is notable that her mother is living at age 102 and has diabetes.  Father died at age 71 and had dementia, Parkinson's disease and COPD.  Her brother is 34 and has a neuropathy.  Her child is 41.  ROS General: Negative; No fevers, chills, or night sweats HEENT:  Positive for facial pain; No changes in vision or hearing, sinus congestion, difficulty swallowing Pulmonary: Negative; No cough, wheezing, shortness of breath, hemoptysis Cardiovascular:  See HPI;  GI:  Positive for GERD GU: Negative; No dysuria, hematuria, or difficulty voiding Musculoskeletal:  Positive for cervical disc disease and arthritis; cervical dystonia for which she undergoes Botox injections every 3 months Hematologic/Oncologic: Negative; no easy bruising, bleeding Endocrine: Negative; no heat/cold intolerance; no diabetes Neuro: Negative; no changes in balance, headaches Skin: Negative; No rashes or skin lesions Psychiatric:  Positive for anxiety Sleep: Negative; No daytime sleepiness, hypersomnolence, bruxism, restless legs, hypnogagnic hallucinations Other comprehensive 14 point system review is negative   Physical Exam BP 102/64   Pulse 61   Ht 5' 2.5" (1.588 m)   Wt 163 lb 12.8 oz (74.3 kg)   SpO2 91%   BMI 29.48 kg/m    Blood Pressure by me was 118/64  Wt Readings from Last 3 Encounters:  08/18/23 163 lb 12.8 oz (74.3 kg)  10/07/22 175 lb (79.4 kg)  08/25/22 174 lb 3.2 oz (79 kg)   General: Alert, oriented, no distress.  Skin: normal turgor, no rashes, warm and dry HEENT: Normocephalic, atraumatic. Pupils equal round and reactive to light; sclera anicteric; extraocular muscles intact;  Nose without nasal septal hypertrophy Mouth/Parynx benign; Mallinpatti scale 3 Neck: No JVD, no carotid bruits; normal carotid upstroke Lungs: clear to ausculatation and percussion; no wheezing or rales Chest wall: without tenderness to palpitation Heart: PMI not displaced, RRR, s1 s2  normal, 1/6 systolic murmur, no diastolic murmur, no rubs, gallops, thrills, or heaves Abdomen: soft, nontender; no hepatosplenomehaly, BS+; abdominal aorta nontender and not dilated by palpation. Back: no CVA tenderness Pulses 2+ Musculoskeletal: full range of motion, normal strength, no joint deformities Extremities: no clubbing cyanosis or edema, Homan's sign negative  Neurologic: grossly nonfocal; Cranial nerves grossly wnl Psychologic: Normal mood and affect   EKG Interpretation Date/Time:  Tuesday August 18 2023 14:31:48 EST Ventricular Rate:  61 PR Interval:  170 QRS Duration:  76 QT Interval:  418 QTC Calculation: 420 R Axis:   39  Text Interpretation: Normal sinus rhythm Normal ECG When compared with ECG of 27-Jun-2014 15:35, No significant change was found Confirmed by Nicki Guadalajara ([id number]) on 08/18/2023 2:56:22 PM   October 07, 2022 ECG (independently read by me):  NSR at 67, LAE  August 25, 2022 ECG (independently read by me): NSR at 65, no ectopy  Feb 13, 2021 ECG (independently read by me): NSR at 60; no ectopy, normal intervals    August 2021 ECG (independently read by me): Normal sinus rhythm at 66 bpm.  No ectopy.  Normal intervals.     September 2019 ECG (independently read by me): Normal sinus rhythm at 69 bpm.  No ST segment changes.  No ectopy.  Normal intervals.  November 2018 ECG (independently read by me): Normal sinus rhythm at 72 bpm.  Normal intervals.  No ST segment changes.  October 2017 ECG (independently read by me): Normal sinus rhythm at 65 bpm.  No ectopy.  Normal intervals.  12/17/2015 ECG (independently read by me):  Normal sinus rhythm at 81 bpm.  Normal intervals.  No ST segment changes.  LABS: I recently reviewed the lab work from Dr. Tomasa Blase from 08/22/2017.  Laboratory from June 22, 2018 showed a total cholesterol 120, HDL 43, LDL 51, triglycerides 130.  Creatinine was 0.74.  Hemoglobin 13.3.  Potassium 3.8.     Latest Ref Rng  & Units 09/01/2022    3:34 PM 01/08/2016   11:02 AM 06/27/2014    3:38 PM  BMP  Glucose 70 - 99 mg/dL 191  74  92   BUN 8 - 27 mg/dL 16  13  11    Creatinine 0.57 - 1.00 mg/dL 4.78  2.95  6.21   BUN/Creat Ratio 12 - 28 21     Sodium 134 - 144 mmol/L 143  139  143   Potassium 3.5 - 5.2 mmol/L 3.9  3.9  4.2   Chloride 96 - 106 mmol/L 99  99  103   CO2 20 - 29 mmol/L 30  30  29    Calcium 8.7 - 10.3 mg/dL 9.7  9.5  9.8        Latest Ref Rng & Units 04/05/2018   12:29 PM 11/10/2017    2:02 PM 01/08/2016   11:02 AM  Hepatic Function  Total Protein 6.0 - 8.5 g/dL 6.6  6.5  6.6   Albumin 3.5 - 4.8 g/dL 4.5  4.4  4.5   AST 0 - 40 IU/L 25  34  24   ALT 0 - 32 IU/L 26  37  29   Alk Phosphatase 39 - 117 IU/L 75  76  63   Total Bilirubin 0.0 - 1.2 mg/dL 0.4  0.4  0.7   Bilirubin, Direct 0.00 - 0.40 mg/dL 4.13  2.44         Latest Ref Rng & Units 01/08/2016   11:02 AM 06/27/2014    3:38 PM  CBC  WBC 3.8 - 10.8 K/uL 5.6  6.3   Hemoglobin 11.7 - 15.5 g/dL 01.0  27.2   Hematocrit 35.0 - 45.0 % 43.5  40.9   Platelets 140 - 400 K/uL 173  138    Lab Results  Component Value Date   MCV 91.4 01/08/2016   MCV 89.5 06/27/2014   Lab Results  Component Value Date   TSH 0.84 01/08/2016   No results found for: "HGBA1C"   BNP No results found for: "BNP"  ProBNP No results found for: "PROBNP"   Lipid Panel     Component Value Date/Time   CHOL 123 04/05/2018 1229   TRIG 124 04/05/2018 1229   HDL 46 04/05/2018 1229   CHOLHDL 2.7 04/05/2018 1229   CHOLHDL 3.2 01/08/2016 1102   VLDL 37 (H) 01/08/2016 1102   LDLCALC 52 04/05/2018 1229    RADIOLOGY: No results found.  ADDENDUM REPORT: 09/05/2022 16:04   EXAM: OVER-READ INTERPRETATION  CT CHEST   The following report is an over-read performed by radiologist Dr. Jacob Moores [facility] on 09/05/2022. This over-read does not include interpretation of cardiac or coronary anatomy or pathology. The coronary CTA  interpretation by the cardiologist is attached.   COMPARISON:  None.   FINDINGS: Vascular: Normal heart size. No pericardial effusion. Normal caliber thoracic aorta with no atherosclerotic disease.   Mediastinum/Nodes: Esophagus is unremarkable. No pathologically enlarged lymph nodes seen in the chest.   Lungs/Pleura: Central airways are patent. No consolidation, pleural effusion or pneumothorax.   Upper Abdomen: No acute abnormality.   Musculoskeletal: No chest wall mass or suspicious bone lesions identified.   IMPRESSION: No acute extracardiac abnormality.     Electronically Signed   By: Allegra Lai M.D.   On: 09/05/2022 16:04    Addended by Renford Dills, MD on 09/05/2022  4:06 PM    Study Result  Narrative & Impression  CLINICAL DATA:  11F with chest pain   EXAM: Cardiac/Coronary CTA   TECHNIQUE: The patient was scanned on a [employer].   FINDINGS: A 100 kV prospective scan was triggered in the descending thoracic aorta at 111 HU's. Axial non-contrast 3 mm slices were carried out through the heart. The data set was analyzed on a dedicated work station and scored using the Agatson method. Gantry rotation speed was 250 msecs and collimation was .6 mm. No beta blockade and 0.8 mg of sl NTG was given. The 3D data set was reconstructed in 5% intervals of the 35-75% of the R-R cycle. Phases were analyzed on a dedicated work station using MPR, MIP and VRT modes. The patient received 100 cc of contrast.   Coronary Arteries:  Normal coronary origin.  Right dominance.   RCA is a large dominant artery that gives rise to PDA and PLA. There is no plaque.   Left main is a large artery that gives rise to LAD and  LCX arteries.   LAD is a large vessel that has no plaque.   LCX is a non-dominant artery that gives rise to one large OM1 branch. There is no plaque.   Other findings:   Left Ventricle: Normal size   Left Atrium: Mild enlargement    Pulmonary Veins: Normal configuration   Right Ventricle: Normal size   Right Atrium: Normal size   Cardiac valves: No calcifications   Thoracic aorta: Normal size   Pulmonary Arteries: Normal size   Systemic Veins: Normal drainage   Pericardium: Normal thickness   IMPRESSION: 1. Coronary calcium score of 0.   2. Normal coronary origin with right dominance.   3. RPDA was not visualized, as the most inferior portion of the heart was not included in the field of view on the CTA. However, it is a small vessel and no calcified plaque seen in this vessel on the calcium score   4. No evidence of CAD.   CAD-RADS 0. No evidence of CAD (0%). Consider non-atherosclerotic causes of chest pain.       IMPRESSION:  1. Essential hypertension   2. Restless leg   3. Shortness of breath   4. Cervical dystonia    ASSESSMENT AND PLAN: Ms. Maryclare Schwartzman is a 56 -year-old female who has a history of hypertension and hyperlipidemia.  Remotely she had been on felodipine for blood pressure control, but ultimately was switched to amlodipine.  In the past, she had stage II hypertension off blood pressure medications.  A prior echo Doppler study revealed  normal systolic function with grade 2 diastolic dysfunction and aortic valve sclerosis without stenosis.  A subsequent echo Doppler study in December 2019 showed an EF of 60 to 65%.  There was normal wall motion.  There was no mention of diastolic dysfunction.  When seen by me in August 2021 she had mild orthostatic blood pressure drop as well symptoms of mild dizziness.  At that time I reduced her amlodipine from 5 mg down to 2.5 mg.  She had experienced shortness of breath with activity and some vague chest tightness.  She underwent coronary CTA which was essentially normal with calcium score 0.  There were no acute extracardiac abnormalities.  A subsequent 2D echo Doppler study from September 16, 2022 essentially was normal with EF 55 to 60%,  normal global strain and normal valves.  Her blood pressure today continues to be stable and on repeat by me was 118/64.  She continues to be on low-dose amlodipine at 2.5 mg and metoprolol succinate 25 mg daily.  She is on Zetia and atorvastatin 20 mg for hyperlipidemia.  Laboratory on July 01, 2023 showed LDL cholesterol now at 69 with total cholesterol 140 and triglycerides 104.  She is on Requip for restless leg syndrome.  She continues to undergo Botox injections for cervical dystonia.  She sees Dr. Tomasa Blase for primary care.  I discussed with her today my plans for future retirement next year.  In 1 year I will transition her to see Dr. Nathaniel Man for follow-up Cardiologic evaluation.   Lennette Bihari, MD, [facility] 08/25/2023 1:12 PM

## 2023-08-25 ENCOUNTER — Encounter: Payer: Self-pay | Admitting: Cardiovascular Disease

## 2023-09-01 ENCOUNTER — Ambulatory Visit: Payer: BC Managed Care – PPO | Admitting: Cardiovascular Disease

## 2023-09-02 DIAGNOSIS — K625 Hemorrhage of anus and rectum: Secondary | ICD-10-CM | POA: Diagnosis not present

## 2023-09-02 DIAGNOSIS — R109 Unspecified abdominal pain: Secondary | ICD-10-CM | POA: Diagnosis not present

## 2023-09-02 DIAGNOSIS — R194 Change in bowel habit: Secondary | ICD-10-CM | POA: Diagnosis not present

## 2023-09-02 DIAGNOSIS — R1310 Dysphagia, unspecified: Secondary | ICD-10-CM | POA: Diagnosis not present

## 2023-09-27 ENCOUNTER — Other Ambulatory Visit: Payer: Self-pay | Admitting: Cardiovascular Disease

## 2023-09-28 ENCOUNTER — Other Ambulatory Visit: Payer: Self-pay | Admitting: Cardiovascular Disease

## 2023-10-01 DIAGNOSIS — I10 Essential (primary) hypertension: Secondary | ICD-10-CM | POA: Diagnosis not present

## 2023-10-01 DIAGNOSIS — E559 Vitamin D deficiency, unspecified: Secondary | ICD-10-CM | POA: Diagnosis not present

## 2023-10-01 DIAGNOSIS — E785 Hyperlipidemia, unspecified: Secondary | ICD-10-CM | POA: Diagnosis not present

## 2023-10-01 DIAGNOSIS — D509 Iron deficiency anemia, unspecified: Secondary | ICD-10-CM | POA: Diagnosis not present

## 2023-10-07 ENCOUNTER — Telehealth: Payer: Self-pay | Admitting: Cardiovascular Disease

## 2023-10-07 MED ORDER — EZETIMIBE 10 MG PO TABS: 10.0000 mg | ORAL_TABLET | Freq: Every day | ORAL | 3 refills | Status: DC

## 2023-10-07 NOTE — Telephone Encounter (Signed)
*  STAT* If patient is at the pharmacy, call can be transferred to refill team.   1. Which medications need to be refilled? (please list name of each medication and dose if known)   ezetimibe  (ZETIA ) 10 MG tablet     4. Which pharmacy/location (including street and city if local pharmacy) is medication to be sent to? [facility] - [place], [place] - [address]     5. Do they need a 30 day or 90 day supply? 90

## 2023-10-07 NOTE — Telephone Encounter (Signed)
 Pt's medication was sent to pt's pharmacy as requested. Confirmation received.

## 2023-10-10 ENCOUNTER — Other Ambulatory Visit: Payer: Self-pay | Admitting: Cardiovascular Disease

## 2023-10-12 ENCOUNTER — Other Ambulatory Visit: Payer: Self-pay

## 2023-10-12 DIAGNOSIS — G243 Spasmodic torticollis: Secondary | ICD-10-CM | POA: Diagnosis not present

## 2023-10-12 MED ORDER — METOPROLOL SUCCINATE ER 25 MG PO TB24: ORAL_TABLET | ORAL | 3 refills | Status: DC

## 2023-10-14 DIAGNOSIS — K219 Gastro-esophageal reflux disease without esophagitis: Secondary | ICD-10-CM | POA: Diagnosis not present

## 2023-10-14 DIAGNOSIS — K644 Residual hemorrhoidal skin tags: Secondary | ICD-10-CM | POA: Diagnosis not present

## 2023-10-14 DIAGNOSIS — Z860100 Personal history of colon polyps, unspecified: Secondary | ICD-10-CM | POA: Diagnosis not present

## 2023-10-14 DIAGNOSIS — K5730 Diverticulosis of large intestine without perforation or abscess without bleeding: Secondary | ICD-10-CM | POA: Diagnosis not present

## 2023-10-14 DIAGNOSIS — K648 Other hemorrhoids: Secondary | ICD-10-CM | POA: Diagnosis not present

## 2023-10-14 DIAGNOSIS — K222 Esophageal obstruction: Secondary | ICD-10-CM | POA: Diagnosis not present

## 2023-10-14 DIAGNOSIS — K921 Melena: Secondary | ICD-10-CM | POA: Diagnosis not present

## 2023-10-14 DIAGNOSIS — R1310 Dysphagia, unspecified: Secondary | ICD-10-CM | POA: Diagnosis not present

## 2023-10-30 ENCOUNTER — Other Ambulatory Visit: Payer: Self-pay | Admitting: Cardiovascular Disease

## 2023-12-09 ENCOUNTER — Other Ambulatory Visit: Payer: Self-pay | Admitting: Cardiovascular Disease

## 2023-12-16 DIAGNOSIS — M8589 Other specified disorders of bone density and structure, multiple sites: Secondary | ICD-10-CM | POA: Diagnosis not present

## 2023-12-16 DIAGNOSIS — Z1231 Encounter for screening mammogram for malignant neoplasm of breast: Secondary | ICD-10-CM | POA: Diagnosis not present

## 2024-01-11 DIAGNOSIS — Z981 Arthrodesis status: Secondary | ICD-10-CM | POA: Diagnosis not present

## 2024-01-11 DIAGNOSIS — G2581 Restless legs syndrome: Secondary | ICD-10-CM | POA: Diagnosis not present

## 2024-01-11 DIAGNOSIS — G629 Polyneuropathy, unspecified: Secondary | ICD-10-CM | POA: Diagnosis not present

## 2024-01-11 DIAGNOSIS — M50322 Other cervical disc degeneration at C5-C6 level: Secondary | ICD-10-CM | POA: Diagnosis not present

## 2024-01-11 DIAGNOSIS — M4802 Spinal stenosis, cervical region: Secondary | ICD-10-CM | POA: Diagnosis not present

## 2024-01-11 DIAGNOSIS — G44209 Tension-type headache, unspecified, not intractable: Secondary | ICD-10-CM | POA: Diagnosis not present

## 2024-01-11 DIAGNOSIS — G243 Spasmodic torticollis: Secondary | ICD-10-CM | POA: Diagnosis not present

## 2024-02-24 DIAGNOSIS — F3342 Major depressive disorder, recurrent, in full remission: Secondary | ICD-10-CM | POA: Diagnosis not present

## 2024-02-24 DIAGNOSIS — M1990 Unspecified osteoarthritis, unspecified site: Secondary | ICD-10-CM | POA: Diagnosis not present

## 2024-03-10 DIAGNOSIS — H04123 Dry eye syndrome of bilateral lacrimal glands: Secondary | ICD-10-CM | POA: Diagnosis not present

## 2024-03-28 DIAGNOSIS — E559 Vitamin D deficiency, unspecified: Secondary | ICD-10-CM | POA: Diagnosis not present

## 2024-03-28 DIAGNOSIS — I10 Essential (primary) hypertension: Secondary | ICD-10-CM | POA: Diagnosis not present

## 2024-03-28 DIAGNOSIS — D509 Iron deficiency anemia, unspecified: Secondary | ICD-10-CM | POA: Diagnosis not present

## 2024-03-28 DIAGNOSIS — K589 Irritable bowel syndrome without diarrhea: Secondary | ICD-10-CM | POA: Diagnosis not present

## 2024-03-28 DIAGNOSIS — E785 Hyperlipidemia, unspecified: Secondary | ICD-10-CM | POA: Diagnosis not present

## 2024-06-01 ENCOUNTER — Telehealth: Payer: Self-pay | Admitting: Cardiology

## 2024-06-01 MED ORDER — EZETIMIBE 10 MG PO TABS: 10.0000 mg | ORAL_TABLET | Freq: Every day | ORAL | 0 refills | Status: DC

## 2024-06-01 NOTE — Telephone Encounter (Signed)
*  STAT* If patient is at the pharmacy, call can be transferred to refill team.   1. Which medications need to be refilled? (please list name of each medication and dose if known) ezetimibe  (ZETIA ) 10 MG tablet    2. Would you like to learn more about the convenience, safety, & potential cost savings by using the [facility]? No   3. Are you open to using the [facility] (Type [facility]) No   4. Which pharmacy/location (including street and city if local pharmacy) is medication to be sent to?  [facility] - [place], [place] - [address]   5. Do they need a 30 day or 90 day supply? 90 day  Pt out of medication and has scheduled appt on 11/25, former [place] pt

## 2024-06-01 NOTE — Telephone Encounter (Signed)
Pt's medication was sent to pt's pharmacy as requested confirmation received.  °

## 2024-08-22 NOTE — Progress Notes (Deleted)
 Cardiology Office Note:    Date:  08/22/2024   ID:  Bethany, Fuller 11-Feb-1946, MRN [id number]  PCP:  Keren Vicenta BRAVO, MD  Cardiologist:  Debby Sor, MD (Inactive)  Electrophysiologist:  None   Referring MD: Keren Vicenta BRAVO, MD   No chief complaint on file. ***  History of Present Illness:    Bethany Fuller is a 78 y.o. female with a hx of hypertension, hyperlipidemia who presents for follow-up.  Previously followed with Dr. Sor.  Coronary CTA 08/2022 showed no evidence of CAD (RPDA was not included in field of view however), calcium  score 0.  Echocardiogram 08/2022 showed EF 55 to 60%, normal RV function, no significant valvular disease.  Since last clinic visit,  Past Medical History:  Diagnosis Date   Arthritis    Cervical dystonia    Constipation    Depression    Family history of anesthesia complication    Mother- nausea   GERD (gastroesophageal reflux disease)    Headache(784.0)    Miagrains- now only once a year. Has light migranies from Dystonia   Hypertension    Pneumonia    2011   Shortness of breath    with extertion    Wears hearing aid    right    Past Surgical History:  Procedure Laterality Date   ABDOMINAL HYSTERECTOMY     CERVICAL FUSION     4, 5, 6   EXCISION OF TONGUE LESION Right 06/28/2014   Procedure: WIDE LOCAL EXCISION OF THE RIGHT LATERAL TONGUE MASS;  Surgeon: Alm Bouche, MD;  Location: [facility] OR;  Service: ENT;  Laterality: Right;    Current Medications: No outpatient medications have been marked as taking for the 08/23/24 encounter (Appointment) with Kate Lonni CROME, MD.     Allergies:   Morphine and codeine and Sulfa antibiotics   Social History   Socioeconomic History   Marital status: Divorced    Spouse name: Not on file   Number of children: Not on file   Years of education: Not on file   Highest education level: Not on file  Occupational History   Not on file  Tobacco Use    Smoking status: Former    Types: Cigarettes   Smokeless tobacco: Never  Substance and Sexual Activity   Alcohol use: No   Drug use: No   Sexual activity: Yes    Birth control/protection: Patch    Comment: quit smoking10 /2014  Other Topics Concern   Not on file  Social History Narrative   Not on file   Social Drivers of Health   Financial Resource Strain: Not on file  Food Insecurity: Not on file  Transportation Needs: Not on file  Physical Activity: Not on file  Stress: Not on file  Social Connections: Not on file     Family History: The patient's ***family history is not on file.  ROS:   Please see the history of present illness.    *** All other systems reviewed and are negative.  EKGs/Labs/Other Studies Reviewed:    The following studies were reviewed today: ***  EKG:  EKG is *** ordered today.  The ekg ordered today demonstrates ***  Recent Labs: No results found for requested labs within last 365 days.  Recent Lipid Panel    Component Value Date/Time   CHOL 123 04/05/2018 1229   TRIG 124 04/05/2018 1229   HDL 46 04/05/2018 1229   CHOLHDL 2.7 04/05/2018 1229   CHOLHDL 3.2 01/08/2016 1102  VLDL 37 (H) 01/08/2016 1102   LDLCALC 52 04/05/2018 1229    Physical Exam:    VS:  There were no vitals taken for this visit.    Wt Readings from Last 3 Encounters:  08/18/23 163 lb 12.8 oz (74.3 kg)  10/07/22 175 lb (79.4 kg)  08/25/22 174 lb 3.2 oz (79 kg)     GEN: *** Well nourished, well developed in no acute distress HEENT: Normal NECK: No JVD; No carotid bruits LYMPHATICS: No lymphadenopathy CARDIAC: ***RRR, no murmurs, rubs, gallops RESPIRATORY:  Clear to auscultation without rales, wheezing or rhonchi  ABDOMEN: Soft, non-tender, non-distended MUSCULOSKELETAL:  No edema; No deformity  SKIN: Warm and dry NEUROLOGIC:  Alert and oriented x 3 PSYCHIATRIC:  Normal affect   ASSESSMENT:    No diagnosis found. PLAN:    Hypertension: On amlodipine   2.5 mg daily and Toprol -XL 25 mg daily  Hyperlipidemia: On atorvastatin  20 mg daily and Zetia  10 mg daily  RTC in***   Medication Adjustments/Labs and Tests Ordered: Current medicines are reviewed at length with the patient today.  Concerns regarding medicines are outlined above.  No orders of the defined types were placed in this encounter.  No orders of the defined types were placed in this encounter.   There are no Patient Instructions on file for this visit.   Signed, Lonni LITTIE Nanas, MD  08/22/2024 3:53 PM    [facility]

## 2024-08-23 ENCOUNTER — Ambulatory Visit: Admitting: Cardiology

## 2024-08-23 ENCOUNTER — Other Ambulatory Visit: Payer: Self-pay | Admitting: Student

## 2024-08-29 MED ORDER — EZETIMIBE 10 MG PO TABS: 10.0000 mg | ORAL_TABLET | Freq: Every day | ORAL | 0 refills | Status: DC

## 2024-08-30 ENCOUNTER — Other Ambulatory Visit: Payer: Self-pay | Admitting: Nurse Practitioner

## 2024-09-02 ENCOUNTER — Other Ambulatory Visit: Payer: Self-pay | Admitting: General Practice

## 2024-09-07 MED ORDER — METOPROLOL SUCCINATE ER 25 MG PO TB24: ORAL_TABLET | ORAL | 0 refills | Status: AC

## 2024-09-07 MED ORDER — ATORVASTATIN CALCIUM 20 MG PO TABS: 20.0000 mg | ORAL_TABLET | Freq: Every day | ORAL | 0 refills | Status: AC

## 2024-09-26 ENCOUNTER — Other Ambulatory Visit: Payer: Self-pay | Admitting: Nurse Practitioner

## 2024-09-28 MED ORDER — EZETIMIBE 10 MG PO TABS: 10.0000 mg | ORAL_TABLET | Freq: Every day | ORAL | 0 refills | Status: AC

## 2024-10-18 NOTE — Progress Notes (Unsigned)
 " Cardiology Office Note:    Date:  10/19/2024   ID:  Bethany Fuller, Bethany Fuller Sep 18, 1946, MRN [id number]  PCP:  Keren Vicenta BRAVO, MD  Cardiologist:  Debby Sor, MD (Inactive)  Electrophysiologist:  None   Referring MD: Keren Vicenta BRAVO, MD   Chief Complaint  Patient presents with   Chest Pain    History of Present Illness:    Bethany Fuller is a 79 y.o. female with a hx of hypertension, hyperlipidemia who presents for follow-up.  Previously followed with Dr. Sor.  Coronary CTA 09/05/2022 showed normal coronary arteries, calcium  score 0 (though RPDA not visualized).  Echocardiogram 08/2022 showed normal biventricular function, no significant valvular disease.  Since last clinic visit, she reports she is doing okay.  Reports occasional chest pain on upper left side of chest, will just last for 2 to 3 seconds.  No clear cause.  She denies any dyspnea, lightheadedness, syncope, lower extremity edema, or palpitations.  Reports she does not exercise.   Past Medical History:  Diagnosis Date   Arthritis    Cervical dystonia    Constipation    Depression    Family history of anesthesia complication    Mother- nausea   GERD (gastroesophageal reflux disease)    Headache(784.0)    Miagrains- now only once a year. Has light migranies from Dystonia   Hypertension    Pneumonia    2011   Shortness of breath    with extertion    Wears hearing aid    right    Past Surgical History:  Procedure Laterality Date   ABDOMINAL HYSTERECTOMY     CERVICAL FUSION     4, 5, 6   EXCISION OF TONGUE LESION Right 06/28/2014   Procedure: WIDE LOCAL EXCISION OF THE RIGHT LATERAL TONGUE MASS;  Surgeon: Alm Bouche, MD;  Location: [facility] OR;  Service: ENT;  Laterality: Right;    Current Medications: Active Medications[1]   Allergies:   Morphine and codeine and Sulfa antibiotics   Social History   Socioeconomic History   Marital status: Divorced    Spouse name: Not on file    Number of children: Not on file   Years of education: Not on file   Highest education level: Not on file  Occupational History   Not on file  Tobacco Use   Smoking status: Former    Types: Cigarettes   Smokeless tobacco: Never  Substance and Sexual Activity   Alcohol use: No   Drug use: No   Sexual activity: Yes    Birth control/protection: Patch    Comment: quit smoking10 /2014  Other Topics Concern   Not on file  Social History Narrative   Not on file   Social Drivers of Health   Tobacco Use: High Risk (07/11/2024)   Received from [facility]   Patient History    Smoking Tobacco Use: Every Day    Smokeless Tobacco Use: Never    Passive Exposure: Current  Financial Resource Strain: Not on file  Food Insecurity: Not on file  Transportation Needs: Not on file  Physical Activity: Not on file  Stress: Not on file  Social Connections: Not on file  Depression (EYV7-0): Not on file  Alcohol Screen: Not on file  Housing: Not on file  Utilities: Not on file  Health Literacy: Not on file     Family History: The patient's family history is not on file.  ROS:   Please see the history of present illness.  All other systems reviewed and are negative.  EKGs/Labs/Other Studies Reviewed:    The following studies were reviewed today:   EKG:   10/19/2024: Normal sinus rhythm, rate 62, no ST abnormalities  Recent Labs: No results found for requested labs within last 365 days.  Recent Lipid Panel    Component Value Date/Time   CHOL 123 04/05/2018 1229   TRIG 124 04/05/2018 1229   HDL 46 04/05/2018 1229   CHOLHDL 2.7 04/05/2018 1229   CHOLHDL 3.2 01/08/2016 1102   VLDL 37 (H) 01/08/2016 1102   LDLCALC 52 04/05/2018 1229    Physical Exam:    VS:  BP 136/86 (BP Location: Right Arm)   Pulse 62   Ht 5' 2 (1.575 m)   Wt 142 lb (64.4 kg)   SpO2 92%   BMI 25.97 kg/m     Wt Readings from Last 3 Encounters:  10/19/24 142 lb (64.4 kg)  08/18/23 163 lb 12.8 oz  (74.3 kg)  10/07/22 175 lb (79.4 kg)     GEN:  Well nourished, well developed in no acute distress HEENT: Normal NECK: No JVD; No carotid bruits LYMPHATICS: No lymphadenopathy CARDIAC: RRR, no murmurs, rubs, gallops RESPIRATORY:  Clear to auscultation without rales, wheezing or rhonchi  ABDOMEN: Soft, non-tender, non-distended MUSCULOSKELETAL:  No edema; No deformity  SKIN: Warm and dry NEUROLOGIC:  Alert and oriented x 3 PSYCHIATRIC:  Normal affect   ASSESSMENT:    1. Chest pain of uncertain etiology   2. Essential hypertension   3. Hyperlipidemia, unspecified hyperlipidemia type    PLAN:    Chest pain: Coronary CTA 09/05/2022 showed normal coronary arteries, calcium  score 0 (though RPDA not visualized).  Echocardiogram 08/2022 showed normal biventricular function, no significant valvular disease. -Descirption of chest pain suggests MSK pain, no further work-up recommended at this time.  Hypertension: On amlodipine  2.5 mg daily and Toprol -XL 25 mg daily.  Appears controlled  Hyperlipidemia: On atorvastatin  20 mg daily and Zetia  10 mg daily.  LDL 84 on 09/27/2024  RTC in 1 year   Medication Adjustments/Labs and Tests Ordered: Current medicines are reviewed at length with the patient today.  Concerns regarding medicines are outlined above.  Orders Placed This Encounter  Procedures   EKG 12-Lead   No orders of the defined types were placed in this encounter.   Patient Instructions  Medication Instructions:  Your physician recommends that you continue on your current medications as directed. Please refer to the Current Medication list given to you today.  *If you need a refill on your cardiac medications before your next appointment, please call your pharmacy*  Lab Work: none If you have labs (blood work) drawn today and your tests are completely normal, you will receive your results only by: MyChart Message (if you have MyChart) OR A paper copy in the mail If you  have any lab test that is abnormal or we need to change your treatment, we will call you to review the results.  Testing/Procedures: none  Follow-Up: At [facility], you and your health needs are our priority.  As part of our continuing mission to provide you with exceptional heart care, our providers are all part of one team.  This team includes your primary Cardiologist (physician) and Advanced Practice Providers or APPs (Physician Assistants and Nurse Practitioners) who all work together to provide you with the care you need, when you need it.  Your next appointment:   1 year  Provider:   Dr. Kate We  recommend signing up for the patient portal called MyChart.  Sign up information is provided on this After Visit Summary.  MyChart is used to connect with patients for Virtual Visits (Telemedicine).  Patients are able to view lab/test results, encounter notes, upcoming appointments, etc.  Non-urgent messages can be sent to your provider as well.   To learn more about what you can do with MyChart, go to [email or web address].   Other Instructions none            Signed, Lonni LITTIE Nanas, MD  10/19/2024 2:29 PM    [facility]     [1]  Current Meds  Medication Sig   albuterol  (VENTOLIN  HFA) 108 (90 Base) MCG/ACT inhaler Inhale 2 puffs into the lungs 4 (four) times daily.   alendronate (FOSAMAX) 70 MG tablet Take 70 mg by mouth once a week.   amLODipine  (NORVASC ) 2.5 MG tablet TAKE 1 TABLET BY MOUTH DAILY   atorvastatin  (LIPITOR) 20 MG tablet Take 1 tablet (20 mg total) by mouth daily. Pt must keep upcoming followup appt with Cardiology in March 2026 for any more refills. Thank You   baclofen (LIORESAL) 10 MG tablet Take 20 mg by mouth 3 (three) times daily.   botulinum toxin Type A (BOTOX) 100 units SOLR injection Inject 600 Units into the skin every 3 (three) months.   calcium  carbonate (OS-CAL) 600 MG TABS tablet Take 1,200 mg by  mouth daily.   Calcium -Magnesium-Vitamin D (CALCIUM  1200+D3 PO) Take 1 tablet by mouth daily.   Cholecalciferol (VITAMIN D-3) 5000 UNITS TABS Take 5,000 Units by mouth daily.   clonazePAM (KLONOPIN) 0.5 MG tablet Take 0.5 mg by mouth 2 (two) times daily.   ezetimibe  (ZETIA ) 10 MG tablet Take 1 tablet (10 mg total) by mouth daily.   gabapentin (NEURONTIN) 300 MG capsule Take 300-900 mg by mouth 3 (three) times daily. 600 mg in the morning, 300 mg at lunch and 1200 mg at night   meloxicam (MOBIC) 15 MG tablet Take 15 mg by mouth daily.   metoprolol  succinate (TOPROL -XL) 25 MG 24 hr tablet TAKE 1 TABLET BY MOUTH DAILY. Pt must keep upcoming followup appt with Cardiology in March 2026 for any more refills. Thank You   Multiple Vitamin (MULTIVITAMIN WITH MINERALS) TABS tablet Take 1 tablet by mouth daily.   Multiple Vitamins-Minerals (HAIR/SKIN/NAILS PO) Take 3 tablets by mouth daily.   NYSTATIN powder Apply topically as needed.   Omega-3 Fatty Acids (FISH OIL) 1200 MG CAPS Take 1,200 mg by mouth daily.   omeprazole (PRILOSEC) 20 MG capsule Take 20 mg by mouth daily.   pilocarpine (SALAGEN) 5 MG tablet Take 5 mg by mouth 3 (three) times daily.   polyethylene glycol (MIRALAX / GLYCOLAX) packet Take 17 g by mouth as needed.   Pyridoxine HCl (VITAMIN B-6 PO) Take 1 tablet by mouth daily.   rOPINIRole (REQUIP) 0.25 MG tablet Take 1 mg by mouth at bedtime.   sertraline (ZOLOFT) 100 MG tablet Take 200 mg by mouth daily.   traMADol (ULTRAM) 50 MG tablet Take 50 mg by mouth as needed.   vitamin E 1000 UNIT capsule Take 1,000 Units by mouth daily.   "

## 2024-10-19 ENCOUNTER — Ambulatory Visit: Attending: Cardiology | Admitting: Cardiology

## 2024-10-19 VITALS — BP 136/86 | HR 62 | Ht 62.0 in | Wt 142.0 lb

## 2024-10-19 DIAGNOSIS — R079 Chest pain, unspecified: Secondary | ICD-10-CM | POA: Diagnosis not present

## 2024-10-19 DIAGNOSIS — E785 Hyperlipidemia, unspecified: Secondary | ICD-10-CM | POA: Diagnosis not present

## 2024-10-19 DIAGNOSIS — I10 Essential (primary) hypertension: Secondary | ICD-10-CM | POA: Diagnosis not present

## 2024-10-19 NOTE — Patient Instructions (Signed)
 Medication Instructions:  Your physician recommends that you continue on your current medications as directed. Please refer to the Current Medication list given to you today.  *If you need a refill on your cardiac medications before your next appointment, please call your pharmacy*  Lab Work: none If you have labs (blood work) drawn today and your tests are completely normal, you will receive your results only by: MyChart Message (if you have MyChart) OR A paper copy in the mail If you have any lab test that is abnormal or we need to change your treatment, we will call you to review the results.  Testing/Procedures: none  Follow-Up: At [facility] you and your health needs are our priority.  As part of our continuing mission to provide you with exceptional heart care, our providers are all part of one team.  This team includes your primary Cardiologist (physician) and Advanced Practice Providers or APPs (Physician Assistants and Nurse Practitioners) who all work together to provide you with the care you need, when you need it.  Your next appointment:   1 year  Provider:   Dr. Kate  We recommend signing up for the patient portal called MyChart.  Sign up information is provided on this After Visit Summary.  MyChart is used to connect with patients for Virtual Visits (Telemedicine).  Patients are able to view lab/test results, encounter notes, upcoming appointments, etc.  Non-urgent messages can be sent to your provider as well.   To learn more about what you can do with MyChart, go to [email or web address].   Other Instructions none

## 2024-10-20 ENCOUNTER — Ambulatory Visit: Admitting: Cardiology

## 2024-12-01 ENCOUNTER — Ambulatory Visit: Admitting: Cardiology
